# Patient Record
Sex: Female | Born: 2000 | Race: Black or African American | Hispanic: No | Marital: Single | Smoking: Never smoker
Health system: Southern US, Community
[De-identification: ages and names within clinical notes are randomized; demographics above are authoritative.]

## PROBLEM LIST (undated history)

## (undated) ENCOUNTER — Inpatient Hospital Stay: Payer: Self-pay

## (undated) ENCOUNTER — Inpatient Hospital Stay (HOSPITAL_COMMUNITY): Payer: Self-pay

## (undated) DIAGNOSIS — D649 Anemia, unspecified: Secondary | ICD-10-CM

## (undated) DIAGNOSIS — R070 Pain in throat: Secondary | ICD-10-CM

## (undated) DIAGNOSIS — R55 Syncope and collapse: Secondary | ICD-10-CM

## (undated) DIAGNOSIS — K37 Unspecified appendicitis: Secondary | ICD-10-CM

## (undated) DIAGNOSIS — R011 Cardiac murmur, unspecified: Secondary | ICD-10-CM

## (undated) DIAGNOSIS — F445 Conversion disorder with seizures or convulsions: Secondary | ICD-10-CM

## (undated) HISTORY — PX: ELBOW SURGERY: SHX618

## (undated) HISTORY — DX: Syncope and collapse: R55

## (undated) HISTORY — DX: Conversion disorder with seizures or convulsions: F44.5

## (undated) HISTORY — PX: ELBOW CLOSED REDUCTION W/ PERCUANEOUS PINNING: SHX1492

---
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
## 2022-01-25 ENCOUNTER — Ambulatory Visit (INDEPENDENT_AMBULATORY_CARE_PROVIDER_SITE_OTHER): Payer: Medicaid Other | Admitting: Plastic Surgery

## 2022-01-25 ENCOUNTER — Encounter: Payer: Self-pay | Admitting: Plastic Surgery

## 2022-01-25 VITALS — BP 120/78 | HR 75 | Ht 59.0 in | Wt 168.8 lb

## 2022-01-25 DIAGNOSIS — Z6834 Body mass index (BMI) 34.0-34.9, adult: Secondary | ICD-10-CM

## 2022-01-25 DIAGNOSIS — N644 Mastodynia: Secondary | ICD-10-CM | POA: Diagnosis not present

## 2022-01-25 DIAGNOSIS — M546 Pain in thoracic spine: Secondary | ICD-10-CM

## 2022-01-25 DIAGNOSIS — M542 Cervicalgia: Secondary | ICD-10-CM | POA: Diagnosis not present

## 2022-01-25 DIAGNOSIS — N62 Hypertrophy of breast: Secondary | ICD-10-CM | POA: Diagnosis not present

## 2022-01-25 NOTE — Progress Notes (Signed)
Referring Provider Norm Salt, PA [address] [place],  [place] [address]   CC:  Chief Complaint  Patient presents with   Consult      Vicki Clayton is an 21 y.o. female.  HPI: Vicki Clayton is a 21 year old female who presents today with complaints of large breasts which cause her breast pain and chest pain and upper back and neck pain.  She is interested in a breast reduction.  She denies any personal history of breast disease or breast cancer.  She is not a smoker.  Allergies  Allergen Reactions   Antihistamines, Diphenhydramine-Type    Citrus    Pineapple     "throat feels like it will close"    Outpatient Encounter Medications as of 01/25/2022  Medication Sig   albuterol (VENTOLIN HFA) 108 (90 Base) MCG/ACT inhaler Inhale into the lungs.   cetirizine (ZYRTEC) 10 MG tablet    Fluticasone Propionate, Inhal, 50 MCG/ACT AEPB    Guaifenesin 1200 MG TB12 Take 1 tablet (1,200 mg total) by mouth in the morning and at bedtime.   medroxyPROGESTERone (DEPO-PROVERA) 150 MG/ML injection Inject 150 mg into the muscle every 3 (three) months.   traMADol (ULTRAM) 50 MG tablet Take 1 tablet (50 mg total) by mouth every 6 (six) hours as needed.   [DISCONTINUED] benzonatate (TESSALON) 100 MG capsule Take 1 capsule (100 mg total) by mouth every 8 (eight) hours.   [DISCONTINUED] cyclobenzaprine (FLEXERIL) 10 MG tablet Take 1 tablet (10 mg total) by mouth 3 (three) times daily as needed for muscle spasms.   [DISCONTINUED] predniSONE (DELTASONE) 20 MG tablet Take 2 tablets (40 mg total) by mouth daily.   No facility-administered encounter medications on file as of 01/25/2022.     Past Medical History:  Diagnosis Date   Anemia    Heart murmur of newborn    Pain in throat 09/02/2018   Syncope     Past Surgical History:  Procedure Laterality Date   ELBOW CLOSED REDUCTION W/ PERCUANEOUS PINNING     ELBOW SURGERY     KNEE ARTHROSCOPY WITH ANTERIOR CRUCIATE LIGAMENT (ACL)  REPAIR Left 05/30/2019   Procedure: KNEE ARTHROSCOPY WITH ANTERIOR CRUCIATE LIGAMENT (ACL) REPAIR WITH QUADRICEPS AUTOGRAFT;  Surgeon: Bjorn Pippin, MD;  Location: [facility];  Service: Orthopedics;  Laterality: Left;    Family History  Problem Relation Age of Onset   Diabetes Other    Hypertension Other    Cancer Other    Migraines Neg Hx    Seizures Neg Hx    Depression Neg Hx    Anxiety disorder Neg Hx    Bipolar disorder Neg Hx    Schizophrenia Neg Hx    ADD / ADHD Neg Hx    Autism Neg Hx     Social History   Social History Narrative   Vicki Clayton is in the 12th grade at [employer]; she struggles in school. She lives with parents. She enjoys dancing and step.      Review of Systems General: Denies fevers, chills, weight loss CV: Denies chest pain, shortness of breath, palpitations Breast: Large breasts which cause her upper back and neck pain and she feels are out of proportion to her body size.  Physical Exam    01/25/2022    1:02 PM 11/27/2021    3:11 PM 11/03/2021   10:59 AM  Vitals with BMI  Height 4\' 11"     Weight 168 lbs 13 oz    BMI 34.08  Systolic 120 110 580  Diastolic 78 56 79  Pulse 75 59 85    General:  No acute distress,  Alert and oriented, Non-Toxic, Normal speech and affect Breast: Patient has very large breasts which I agree do not fit her body proportions.  Her breasts are pendulous with grade 3 ptosis.  Her sternal notch to nipple distance on the right is 28 cm and 29 cm on the left her nipple to fold distance is 18 cm on the right and 18 cm on the left.  She does not have any appreciable masses no nipple discharge and no nipple abnormalities. Mammogram: She has not begun mammographic screening Assessment/Plan Macromastia: Patient has large breasts and would likely benefit from a breast reduction.  I believe I can remove 400 to 500 g per breast.  We discussed the procedure at length including the location of the incisions and the  unpredictable nature of scarring.  We discussed the surgical risks of bleeding, infection, seroma formation.  We discussed the specific risks associated with a breast reduction including loss of the nipple due to ischemia, loss of feeling in the nipples.  He understands that she will have patches of numbness on her breasts.  We also discussed the fact that she may not be able to breast-feed successfully after breast reduction.  She understands that she will have drains which will need to be removed the day after surgery.  We discussed the specific activity restrictions including no heavy lifting greater than 20 pounds, no vigorous activity, and no submerging the incisions in water for 6 weeks.  She will be able to return to light activity as soon as she is comfortable.  She will be able to resume driving as soon as she has been off narcotic pain medicine for 24 hours.  All questions were answered to her satisfaction.  Will proceed at her request.  Vicki Clayton 01/25/2022, 1:28 PM

## 2022-02-05 ENCOUNTER — Encounter (HOSPITAL_COMMUNITY): Payer: Self-pay

## 2022-02-05 ENCOUNTER — Ambulatory Visit (HOSPITAL_COMMUNITY)
Admission: EM | Admit: 2022-02-05 | Discharge: 2022-02-05 | Disposition: A | Discharge status: Home / Self Care | Payer: Medicaid Other | Attending: Internal Medicine | Admitting: Internal Medicine

## 2022-02-05 DIAGNOSIS — Z3202 Encounter for pregnancy test, result negative: Secondary | ICD-10-CM | POA: Diagnosis present

## 2022-02-05 DIAGNOSIS — Z202 Contact with and (suspected) exposure to infections with a predominantly sexual mode of transmission: Secondary | ICD-10-CM | POA: Insufficient documentation

## 2022-02-05 DIAGNOSIS — N3001 Acute cystitis with hematuria: Secondary | ICD-10-CM | POA: Diagnosis not present

## 2022-02-05 LAB — POCT URINALYSIS DIPSTICK, ED / UC
Bilirubin Urine: NEGATIVE
Glucose, UA: NEGATIVE mg/dL
Ketones, ur: NEGATIVE mg/dL
Nitrite: POSITIVE — AB
Protein, ur: 100 mg/dL — AB
Specific Gravity, Urine: 1.025 (ref 1.005–1.030)
Urobilinogen, UA: 2 mg/dL — ABNORMAL HIGH (ref 0.0–1.0)
pH: 7 (ref 5.0–8.0)

## 2022-02-05 LAB — POC URINE PREG, ED: Preg Test, Ur: NEGATIVE

## 2022-02-05 MED ORDER — CEPHALEXIN 500 MG PO CAPS: 500.0000 mg | ORAL_CAPSULE | Freq: Two times a day (BID) | ORAL | 0 refills | Status: AC

## 2022-02-05 NOTE — ED Triage Notes (Signed)
Pt is here for dysuria x 1 day. Denies taking any medication at this time.

## 2022-02-05 NOTE — Discharge Instructions (Addendum)
You have been diagnosed with urinary tract infection today.  I have prescribed Keflex for you to take 500 mg twice daily for the next 7 days.  If you develop diarrhea while taking this medication you may purchase an over-the-counter probiotic or eat yogurt with live active cultures.  To avoid GI upset please take this medication with food. I have sent your urine for culture to see what type of bacteria grows. We will call you if we need to change the treatment plan based on the results of your urine culture.  If you develop any new or worsening symptoms or do not improve in the next 2 to 3 days, please return.  If your symptoms are severe, please go to the emergency room.  Follow-up with your primary care provider for further evaluation and management of your symptoms as well as ongoing wellness visits.  I hope you feel better! 

## 2022-02-05 NOTE — ED Provider Notes (Signed)
[facility]    CSN: [id number] Arrival date & time: 02/05/22  1640      History   Chief Complaint Chief Complaint  Patient presents with   Urinary Tract Infection   SEXUALLY TRANSMITTED DISEASE    HPI Vicki Clayton is a 22 y.o. female.   Patient presents urgent care for evaluation of slight burning with urination and urinary frequency since yesterday. States she saw some blood to the toilet tissue when wiping after using the restroom to urinate yesterday as well. Reports suprapubic abdominal discomfort, slight nausea without vomiting, and tingling sensation when urinating as well.  Tolerating food and fluids well without emesis, diarrhea, or constipation.  Denies flank pain, fever/chills, dizziness, and recurring/frequent urinary tract infections.  She would also like to be screened for STDs today.  She is not currently having any vaginal symptoms and denies recent new sexual partners/known exposure to STD.  She has not attempted use of any medicines prior to arrival urgent care for symptoms.  She was receiving Depo injection for birth control but has not received this injection in 1 to 2 months.  She is sexually active with monogamous female partner without condom use.  Last menstrual cycle was the beginning of November 2023, however menstrual cycles are irregular. Denies excessive intake of known urinary irritants and history of diabetes mellitus.    Urinary Tract Infection   Past Medical History:  Diagnosis Date   Anemia    Heart murmur of newborn    Pain in throat 09/02/2018   Syncope     Patient Active Problem List   Diagnosis Date Noted   Pain in throat 09/02/2018    Past Surgical History:  Procedure Laterality Date   ELBOW CLOSED REDUCTION W/ PERCUANEOUS PINNING     ELBOW SURGERY     KNEE ARTHROSCOPY WITH ANTERIOR CRUCIATE LIGAMENT (ACL) REPAIR Left 05/30/2019   Procedure: KNEE ARTHROSCOPY WITH ANTERIOR CRUCIATE LIGAMENT (ACL) REPAIR WITH QUADRICEPS  AUTOGRAFT;  Surgeon: Hiram Gash, MD;  Location: [facility];  Service: Orthopedics;  Laterality: Left;    OB History     Gravida  0   Para  0   Term  0   Preterm  0   AB  0   Living  0      SAB  0   IAB  0   Ectopic  0   Multiple  0   Live Births  0            Home Medications    Prior to Admission medications   Medication Sig Start Date End Date Taking? Authorizing Provider  cephALEXin (KEFLEX) 500 MG capsule Take 1 capsule (500 mg total) by mouth 2 (two) times daily for 7 days. 02/05/22 02/12/22 Yes Talbot Grumbling, FNP  albuterol (VENTOLIN HFA) 108 (90 Base) MCG/ACT inhaler Inhale into the lungs. 10/20/17   [provider]  cetirizine (ZYRTEC) 10 MG tablet     [provider]  Fluticasone Propionate, Inhal, 50 MCG/ACT AEPB     [provider]  Guaifenesin 1200 MG TB12 Take 1 tablet (1,200 mg total) by mouth in the morning and at bedtime. 11/03/21   Talbot Grumbling, FNP  medroxyPROGESTERone (DEPO-PROVERA) 150 MG/ML injection Inject 150 mg into the muscle every 3 (three) months.    [provider]  traMADol (ULTRAM) 50 MG tablet Take 1 tablet (50 mg total) by mouth every 6 (six) hours as needed. 07/25/21   White, Leitha Schuller,  NP    Family History Family History  Problem Relation Age of Onset   Diabetes Other    Hypertension Other    Cancer Other    Migraines Neg Hx    Seizures Neg Hx    Depression Neg Hx    Anxiety disorder Neg Hx    Bipolar disorder Neg Hx    Schizophrenia Neg Hx    ADD / ADHD Neg Hx    Autism Neg Hx     Social History Social History   Tobacco Use   Smoking status: Never   Smokeless tobacco: Never  Vaping Use   Vaping Use: Never used  Substance Use Topics   Alcohol use: No   Drug use: No     Allergies   Antihistamines, diphenhydramine-type; Citrus; and Pineapple   Review of Systems Review of Systems Per HPI  Physical Exam Triage Vital Signs ED Triage  Vitals  Enc Vitals Group     BP 02/05/22 1648 126/87     Pulse Rate 02/05/22 1647 78     Resp 02/05/22 1709 18     Temp 02/05/22 1647 98.3 F (36.8 C)     Temp Source 02/05/22 1647 Oral     SpO2 02/05/22 1647 98 %     Weight --      Height --      Head Circumference --      Peak Flow --      Pain Score 02/05/22 1641 9     Pain Loc --      Pain Edu? --      Excl. in GC? --    No data found.  Updated Vital Signs BP 126/87 (BP Location: Left Arm)   Pulse 78   Temp 98.3 F (36.8 C) (Oral)   Resp 18   SpO2 98%   Visual Acuity Right Eye Distance:   Left Eye Distance:   Bilateral Distance:    Right Eye Near:   Left Eye Near:    Bilateral Near:     Physical Exam Vitals and nursing note reviewed.  Constitutional:      Appearance: She is not ill-appearing or toxic-appearing.  HENT:     Head: Normocephalic and atraumatic.     Right Ear: Hearing and external ear normal.     Left Ear: Hearing and external ear normal.     Nose: Nose normal.     Mouth/Throat:     Lips: Pink.     Mouth: Mucous membranes are moist.     Pharynx: No posterior oropharyngeal erythema.  Eyes:     General: Lids are normal. Vision grossly intact. Gaze aligned appropriately.     Extraocular Movements: Extraocular movements intact.     Conjunctiva/sclera: Conjunctivae normal.  Cardiovascular:     Rate and Rhythm: Normal rate and regular rhythm.     Heart sounds: Normal heart sounds, S1 normal and S2 normal.  Pulmonary:     Effort: Pulmonary effort is normal. No respiratory distress.     Breath sounds: Normal breath sounds and air entry.  Abdominal:     General: Bowel sounds are normal.     Palpations: Abdomen is soft.     Tenderness: There is no abdominal tenderness. There is no right CVA tenderness, left CVA tenderness or guarding.  Musculoskeletal:     Cervical back: Neck supple.  Skin:    General: Skin is warm and dry.     Capillary Refill: Capillary refill takes less than 2 seconds.  Findings: No rash.  Neurological:     General: No focal deficit present.     Mental Status: She is alert and oriented to person, place, and time. Mental status is at baseline.     Cranial Nerves: No dysarthria or facial asymmetry.  Psychiatric:        Mood and Affect: Mood normal.        Speech: Speech normal.        Behavior: Behavior normal.        Thought Content: Thought content normal.        Judgment: Judgment normal.      UC Treatments / Results  Labs (all labs ordered are listed, but only abnormal results are displayed) Labs Reviewed  POCT URINALYSIS DIPSTICK, ED / UC - Abnormal; Notable for the following components:      Result Value   Hgb urine dipstick TRACE (*)    Protein, ur 100 (*)    Urobilinogen, UA 2.0 (*)    Nitrite POSITIVE (*)    Leukocytes,Ua MODERATE (*)    All other components within normal limits  URINE CULTURE  POC URINE PREG, ED  CERVICOVAGINAL ANCILLARY ONLY    EKG   Radiology No results found.  Procedures Procedures (including critical care time)  Medications Ordered in UC Medications - No data to display  Initial Impression / Assessment and Plan / UC Course  I have reviewed the triage vital signs and the nursing notes.  Pertinent labs & imaging results that were available during my care of the patient were reviewed by me and considered in my medical decision making (see chart for details).   1. Acute cystitis with hematuria, negative urine pregnancy test Urine pregnancy test is negative. Urinalysis is nitrite positive and shows moderate leukocytes and trace hemoglobin consistent with likely acute uncomplicated cystitis.  We will manage this with Keflex twice daily for the next 7 days.  Urine culture is pending.  Will change therapy based on urine culture results if necessary.  Advised to avoid urinary irritants and increase water intake to at least 64 ounces of water per day to stay well hydrated. May use tylenol as needed for suprapubic  discomfort related to urinary tract infection. Discussed methods of UTI prevention such as voiding after intercourse and avoiding urinary irritants.   2. Possible exposure to STD STI labs pending.  Patient declines HIV and syphilis testing today.  Will notify patient of positive results and treat accordingly when labs come back.  Patient to avoid sexual intercourse until screening testing comes back.  Education provided regarding safe sexual practices and patient encouraged to use protection to prevent spread of STIs.   Discussed physical exam and available lab work findings in clinic with patient.  Counseled patient regarding appropriate use of medications and potential side effects for all medications recommended or prescribed today. Discussed red flag signs and symptoms of worsening condition,when to call the PCP office, return to urgent care, and when to seek higher level of care in the emergency department. Patient verbalizes understanding and agreement with plan. All questions answered. Patient discharged in stable condition.    Final Clinical Impressions(s) / UC Diagnoses   Final diagnoses:  Acute cystitis with hematuria  Urine pregnancy test negative  Possible exposure to STD     Discharge Instructions      You have been diagnosed with urinary tract infection today.  I have prescribed Keflex for you to take 500 mg twice daily for the next 7 days.  If you develop diarrhea while taking this medication you may purchase an over-the-counter probiotic or eat yogurt with live active cultures.  To avoid GI upset please take this medication with food. I have sent your urine for culture to see what type of bacteria grows. We will call you if we need to change the treatment plan based on the results of your urine culture.  If you develop any new or worsening symptoms or do not improve in the next 2 to 3 days, please return.  If your symptoms are severe, please go to the emergency room.  Follow-up  with your primary care provider for further evaluation and management of your symptoms as well as ongoing wellness visits.  I hope you feel better!     ED Prescriptions     Medication Sig Dispense Auth. Provider   cephALEXin (KEFLEX) 500 MG capsule Take 1 capsule (500 mg total) by mouth 2 (two) times daily for 7 days. 14 capsule Carlisle Beers, FNP      PDMP not reviewed this encounter.   Carlisle Beers, [place] 02/05/22 2149

## 2022-02-07 LAB — CERVICOVAGINAL ANCILLARY ONLY
Bacterial Vaginitis (gardnerella): POSITIVE — AB
Candida Glabrata: NEGATIVE
Candida Vaginitis: NEGATIVE
Chlamydia: POSITIVE — AB
Comment: NEGATIVE
Comment: NEGATIVE
Comment: NEGATIVE
Comment: NEGATIVE
Comment: NEGATIVE
Comment: NORMAL
Neisseria Gonorrhea: POSITIVE — AB
Trichomonas: POSITIVE — AB

## 2022-02-07 LAB — URINE CULTURE: Culture: >=100000 — AB

## 2022-02-08 ENCOUNTER — Telehealth (HOSPITAL_COMMUNITY): Payer: Self-pay | Admitting: Emergency Medicine

## 2022-02-08 MED ORDER — METRONIDAZOLE 500 MG PO TABS: 500.0000 mg | ORAL_TABLET | Freq: Two times a day (BID) | ORAL | 0 refills | Status: DC

## 2022-02-08 MED ORDER — DOXYCYCLINE HYCLATE 100 MG PO CAPS: 100.0000 mg | ORAL_CAPSULE | Freq: Two times a day (BID) | ORAL | 0 refills | Status: AC

## 2022-02-08 NOTE — Telephone Encounter (Signed)
Per protocol, patient will need treatment with IM Rocephin 500mg  for positive Gonorrhea. Will also need treatment with Doxycycline, and Metronidazole.   Results seen on mychart Prescription sent to pharmacy on file

## 2022-02-14 ENCOUNTER — Ambulatory Visit (HOSPITAL_COMMUNITY): Payer: Medicaid Other

## 2022-03-08 ENCOUNTER — Telehealth: Payer: Self-pay | Admitting: *Deleted

## 2022-03-08 NOTE — Telephone Encounter (Signed)
Submitted PA request via portal for CPT [id number]  Pending: [id number]

## 2022-03-22 ENCOUNTER — Telehealth: Payer: Self-pay | Admitting: Plastic Surgery

## 2022-03-22 NOTE — Telephone Encounter (Signed)
Vicki Clayton called about a prior auth that was submitted. They need to know has the patient tried things to improve before going straight to a breast reduction sx. For examples physical therapy, chiropractor, they just want to make sure Vicki Clayton has tried other treatment before going to sx. Phone number [phone], fax [phone], [place] [phone]

## 2022-04-04 ENCOUNTER — Telehealth: Payer: Self-pay | Admitting: *Deleted

## 2022-04-04 NOTE — Telephone Encounter (Signed)
Call received from pt to check on auth status. Advised her we had received a call from her insurance asking if she had tried any conservative therapy. she was with her PCP at time of call who states she will place the referral for PT or chiro. Advised pt to contact us when she has set up appointment so we can schedule a f/u when treatment is finished. She verbalized understanding

## 2022-04-07 ENCOUNTER — Emergency Department (HOSPITAL_COMMUNITY)
Admission: EM | Admit: 2022-04-07 | Discharge: 2022-04-07 | Disposition: A | Discharge status: Home / Self Care | Payer: Medicaid Other | Attending: Emergency Medicine | Admitting: Emergency Medicine

## 2022-04-07 ENCOUNTER — Other Ambulatory Visit: Payer: Self-pay

## 2022-04-07 ENCOUNTER — Encounter (HOSPITAL_COMMUNITY): Payer: Self-pay

## 2022-04-07 DIAGNOSIS — T7840XA Allergy, unspecified, initial encounter: Secondary | ICD-10-CM

## 2022-04-07 DIAGNOSIS — L299 Pruritus, unspecified: Secondary | ICD-10-CM | POA: Diagnosis present

## 2022-04-07 MED ORDER — PREDNISONE 20 MG PO TABS: 40.0000 mg | ORAL_TABLET | Freq: Every day | ORAL | 0 refills | Status: AC

## 2022-04-07 MED ORDER — FAMOTIDINE IN NACL 20-0.9 MG/50ML-% IV SOLN
20.0000 mg | Freq: Once | INTRAVENOUS | Status: AC
  Administered 2022-04-07: 20 mg via INTRAVENOUS
  Filled 2022-04-07: qty 50

## 2022-04-07 MED ORDER — FAMOTIDINE 20 MG PO TABS: 20.0000 mg | ORAL_TABLET | Freq: Two times a day (BID) | ORAL | 0 refills | Status: DC

## 2022-04-07 MED ORDER — METHYLPREDNISOLONE SODIUM SUCC 125 MG IJ SOLR
125.0000 mg | Freq: Once | INTRAMUSCULAR | Status: AC
  Administered 2022-04-07: 125 mg via INTRAVENOUS
  Filled 2022-04-07: qty 2

## 2022-04-07 MED ORDER — CETIRIZINE HCL 10 MG PO TABS: ORAL_TABLET | ORAL | 0 refills | Status: DC

## 2022-04-07 MED ORDER — LORATADINE 10 MG PO TABS
10.0000 mg | ORAL_TABLET | Freq: Once | ORAL | Status: AC
  Administered 2022-04-07: 10 mg via ORAL
  Filled 2022-04-07: qty 1

## 2022-04-07 NOTE — ED Provider Notes (Signed)
[facility] Provider Note   CSN: [id number] Arrival date & time: 04/07/22  1136     History  Chief Complaint  Patient presents with   Allergic Reaction    Vicki Clayton is a 22 y.o. female.  Vicki Clayton is a 22 y.o. 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  Patient was given herbal tea at work by a coworker and suddenly felt a tingling and itching feeling in her lips, mouth and throat.  Patient denies any facial swelling, no difficulty breathing.  She reports some generalized itching but no rash noted.  Patient has reported allergy to Benadryl so no meds were given with EMS.  The history is provided by the patient and a significant other.  Allergic Reaction Presenting symptoms: no difficulty swallowing and no rash        Home Medications Prior to Admission medications   Medication Sig Start Date End Date Taking? Authorizing Provider  albuterol (VENTOLIN HFA) 108 (90 Base) MCG/ACT inhaler Inhale into the lungs. 10/20/17   [provider]  cetirizine (ZYRTEC) 10 MG tablet     [provider]  Fluticasone Propionate, Inhal, 50 MCG/ACT AEPB     [provider]  Guaifenesin 1200 MG TB12 Take 1 tablet (1,200 mg total) by mouth in the morning and at bedtime. 11/03/21   Talbot Grumbling, FNP  medroxyPROGESTERone (DEPO-PROVERA) 150 MG/ML injection Inject 150 mg into the muscle every 3 (three) months.    [provider]  metroNIDAZOLE (FLAGYL) 500 MG tablet Take 1 tablet (500 mg total) by mouth 2 (two) times daily. 02/08/22   Lamptey, Myrene Galas, MD  traMADol (ULTRAM) 50 MG tablet Take 1 tablet (50 mg total) by mouth every 6 (six) hours as needed. 07/25/21   Hans Eden, NP      Allergies    Antihistamines, diphenhydramine-type; Citrus; and Pineapple    Review of Systems   Review of Systems   Constitutional:  Negative for chills and fever.  HENT:  Negative for facial swelling and trouble swallowing.   Skin:  Negative for rash.       Pruritus    Physical Exam Updated Vital Signs BP 112/67   Pulse 77   Temp 98.5 F (36.9 C) (Oral)   Resp 18   SpO2 100%  Physical Exam Vitals and nursing note reviewed.  Constitutional:      General: She is not in acute distress.    Appearance: Normal appearance. She is well-developed. She is not ill-appearing or diaphoretic.  HENT:     Head: Normocephalic and atraumatic.     Mouth/Throat:     Comments: No swelling noted of the lips, tongue, posterior oropharynx, no intraoral lesions noted, patient tolerating secretions without difficulty, normal phonation Eyes:     General:        Right eye: No discharge.        Left eye: No discharge.  Cardiovascular:     Rate and Rhythm: Normal rate and regular rhythm.  Pulmonary:     Effort: Pulmonary effort is normal. No respiratory distress.     Breath sounds: Normal breath sounds. No stridor.  Musculoskeletal:     Cervical back: Neck supple.  Skin:    Findings: No rash.     Comments: No hives or other rashes noted.  Neurological:     Mental Status: She is alert and oriented  to person, place, and time.     Coordination: Coordination normal.  Psychiatric:        Mood and Affect: Mood normal.        Behavior: Behavior normal.     ED Results / Procedures / Treatments   Labs (all labs ordered are listed, but only abnormal results are displayed) Labs Reviewed - No data to display  EKG None  Radiology No results found.  Procedures Procedures    Medications Ordered in ED Medications - No data to display  ED Course/ Medical Decision Making/ A&P                             Medical Decision Making Risk OTC drugs. Prescription drug management.   Patient presents via EMS with concerns for an allergic reaction after consuming herbal tea, otherwise no known underlying  allergies to pineapple and citrus.  Upon arrival patient reports some oral and throat itching but no facial swelling, no stridor no difficulty breathing, no contacts.  Exam does not suggest anaphylaxis patient is alert with stable vitals.  Patient has reported Benadryl allergy but takes Zyrtec at home.  Will give Pepcid, loratadine and IV Solu-Medrol and monitor patient.  Patient observed in ED for 2 hours with resolution of symptoms and no worsening.  Will have her continue with Pepcid, cetirizine and prednisone for the next few days.  Discussed return precautions and follow-up.  Discharged home in good condition.        Final Clinical Impression(s) / ED Diagnoses Final diagnoses:  Allergic reaction, initial encounter    Rx / DC Orders ED Discharge Orders          Ordered    cetirizine (ZYRTEC) 10 MG tablet        04/07/22 1340    famotidine (PEPCID) 20 MG tablet  2 times daily        04/07/22 1340    predniSONE (DELTASONE) 20 MG tablet  Daily with breakfast        04/07/22 1340              Vicki Clayton, [place] 04/21/22 1556    Vicki Muskrat, MD 04/21/22 [phone]

## 2022-04-07 NOTE — ED Notes (Signed)
Pt ambulatory to and from restroom with steady gait 

## 2022-04-07 NOTE — Discharge Instructions (Signed)
You had a mild allergic reaction today. Please avoid herbal teas. Take prescribed medications for the next 5 days, return for new or worsening symptoms.

## 2022-04-07 NOTE — ED Triage Notes (Signed)
Pt bib ems for allergic reaction. Pt has multiple food allergies, drank some herbal tea at work and suddenly felt lip tingling and throat burning. No swelling noted, rr e/u, lungs clear

## 2022-04-11 ENCOUNTER — Ambulatory Visit (HOSPITAL_COMMUNITY)
Admission: EM | Admit: 2022-04-11 | Discharge: 2022-04-11 | Disposition: A | Discharge status: Home / Self Care | Payer: Medicaid Other | Attending: Physician Assistant | Admitting: Physician Assistant

## 2022-04-11 ENCOUNTER — Encounter (HOSPITAL_COMMUNITY): Payer: Self-pay | Admitting: Emergency Medicine

## 2022-04-11 DIAGNOSIS — R112 Nausea with vomiting, unspecified: Secondary | ICD-10-CM | POA: Diagnosis not present

## 2022-04-11 DIAGNOSIS — R197 Diarrhea, unspecified: Secondary | ICD-10-CM

## 2022-04-11 DIAGNOSIS — Z1152 Encounter for screening for COVID-19: Secondary | ICD-10-CM | POA: Diagnosis not present

## 2022-04-11 DIAGNOSIS — Z79899 Other long term (current) drug therapy: Secondary | ICD-10-CM | POA: Diagnosis not present

## 2022-04-11 DIAGNOSIS — Z793 Long term (current) use of hormonal contraceptives: Secondary | ICD-10-CM | POA: Diagnosis not present

## 2022-04-11 LAB — POC URINE PREG, ED: Preg Test, Ur: NEGATIVE

## 2022-04-11 LAB — POCT URINALYSIS DIPSTICK, ED / UC
Bilirubin Urine: NEGATIVE
Glucose, UA: NEGATIVE mg/dL
Hgb urine dipstick: NEGATIVE
Ketones, ur: NEGATIVE mg/dL
Leukocytes,Ua: NEGATIVE
Nitrite: NEGATIVE
Protein, ur: 30 mg/dL — AB
Specific Gravity, Urine: >=1.03 (ref 1.005–1.030)
Urobilinogen, UA: 1 mg/dL (ref 0.0–1.0)
pH: 6 (ref 5.0–8.0)

## 2022-04-11 MED ORDER — ONDANSETRON HCL 4 MG PO TABS: 4.0000 mg | ORAL_TABLET | Freq: Three times a day (TID) | ORAL | 0 refills | Status: DC | PRN

## 2022-04-11 MED ORDER — ONDANSETRON 4 MG PO TBDP
4.0000 mg | ORAL_TABLET | Freq: Once | ORAL | Status: AC
  Administered 2022-04-11: 4 mg via ORAL

## 2022-04-11 MED ORDER — ONDANSETRON 4 MG PO TBDP
ORAL_TABLET | ORAL | Status: AC
  Filled 2022-04-11: qty 1

## 2022-04-11 NOTE — ED Triage Notes (Signed)
Pt reports for past couple days since left hospital having sore throat, n/v, dizziness and chills. Reports was given medications that aren't helping.

## 2022-04-11 NOTE — ED Provider Notes (Signed)
[place]    CSN: [id number] Arrival date & time: 04/11/22  [place]      History   Chief Complaint Chief Complaint  Patient presents with   Emesis   Dizziness    HPI Vicki Clayton is a 22 y.o. female.   Patient complains of of sore throat, nausea, vomiting, chills that started a few days ago.  She was seen in the ED 4 days ago for an allergic reaction.  She has been taking Pepcid and Zyrtec.  She reports 1 episode of vomiting today.  Denies congestion, cough.  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    Past Medical History:  Diagnosis Date   Anemia    Heart murmur of newborn    Pain in throat 09/02/2018   Syncope     Patient Active Problem List   Diagnosis Date Noted   Pain in throat 09/02/2018    Past Surgical History:  Procedure Laterality Date   ELBOW CLOSED REDUCTION W/ PERCUANEOUS PINNING     ELBOW SURGERY     KNEE ARTHROSCOPY WITH ANTERIOR CRUCIATE LIGAMENT (ACL) REPAIR Left 05/30/2019   Procedure: KNEE ARTHROSCOPY WITH ANTERIOR CRUCIATE LIGAMENT (ACL) REPAIR WITH QUADRICEPS AUTOGRAFT;  Surgeon: Hiram Gash, MD;  Location: [facility];  Service: Orthopedics;  Laterality: Left;    OB History     Gravida  0   Para  0   Term  0   Preterm  0   AB  0   Living  0      SAB  0   IAB  0   Ectopic  0   Multiple  0   Live Births  0            Home Medications    Prior to Admission medications   Medication Sig Start Date End Date Taking? Authorizing Provider  ondansetron (ZOFRAN) 4 MG tablet Take 1 tablet (4 mg total) by mouth every 8 (eight) hours as needed for nausea or vomiting. 04/11/22  Yes Ward, Lenise Arena, PA-C  albuterol (VENTOLIN HFA) 108 (90 Base) MCG/ACT inhaler Inhale into the lungs. 10/20/17   [provider]  cetirizine (ZYRTEC) 10 MG tablet Take 1 tablet twice daily for the next 5 days, then resume taking 1 tablet daily 04/07/22   Jacqlyn Larsen, PA-C   famotidine (PEPCID) 20 MG tablet Take 1 tablet (20 mg total) by mouth 2 (two) times daily for 5 days. 04/07/22 04/12/22  Jacqlyn Larsen, PA-C  Fluticasone Propionate, Inhal, 50 MCG/ACT AEPB     [provider]  Guaifenesin 1200 MG TB12 Take 1 tablet (1,200 mg total) by mouth in the morning and at bedtime. 11/03/21   Talbot Grumbling, FNP  medroxyPROGESTERone (DEPO-PROVERA) 150 MG/ML injection Inject 150 mg into the muscle every 3 (three) months.    [provider]  metroNIDAZOLE (FLAGYL) 500 MG tablet Take 1 tablet (500 mg total) by mouth 2 (two) times daily. 02/08/22   Lamptey, Myrene Galas, MD  predniSONE (DELTASONE) 20 MG tablet Take 2 tablets (40 mg total) by mouth daily with breakfast for 5 days. Starting tomorrow morning 04/07/22 04/12/22  Jacqlyn Larsen, PA-C  traMADol (ULTRAM) 50 MG tablet Take 1 tablet (50 mg total) by mouth every 6 (six) hours as needed. 07/25/21   Hans Eden, NP    Family History Family History  Problem Relation Age of Onset   Diabetes Other  Hypertension Other    Cancer Other    Migraines Neg Hx    Seizures Neg Hx    Depression Neg Hx    Anxiety disorder Neg Hx    Bipolar disorder Neg Hx    Schizophrenia Neg Hx    ADD / ADHD Neg Hx    Autism Neg Hx     Social History Social History   Tobacco Use   Smoking status: Never   Smokeless tobacco: Never  Vaping Use   Vaping Use: Never used  Substance Use Topics   Alcohol use: No   Drug use: No     Allergies   Antihistamines, diphenhydramine-type; Citrus; and Pineapple   Review of Systems Review of Systems  Constitutional:  Positive for chills. Negative for fever.  HENT:  Positive for sore throat. Negative for ear pain.   Eyes:  Negative for pain and visual disturbance.  Respiratory:  Negative for cough and shortness of breath.   Cardiovascular:  Negative for chest pain and palpitations.  Gastrointestinal:  Positive for nausea and vomiting. Negative for abdominal pain.   Genitourinary:  Negative for dysuria and hematuria.  Musculoskeletal:  Negative for arthralgias and back pain.  Skin:  Negative for color change and rash.  Neurological:  Negative for seizures and syncope.  All other systems reviewed and are negative.    Physical Exam Triage Vital Signs ED Triage Vitals  Enc Vitals Group     BP 04/11/22 1853 98/69     Pulse Rate 04/11/22 1853 76     Resp 04/11/22 1853 16     Temp 04/11/22 1853 98.2 F (36.8 C)     Temp Source 04/11/22 1853 Oral     SpO2 04/11/22 1853 98 %     Weight --      Height --      Head Circumference --      Peak Flow --      Pain Score 04/11/22 1852 0     Pain Loc --      Pain Edu? --      Excl. in [place]? --    No data found.  Updated Vital Signs BP 98/69 (BP Location: Right Arm)   Pulse 76   Temp 98.2 F (36.8 C) (Oral)   Resp 16   SpO2 98%   Visual Acuity Right Eye Distance:   Left Eye Distance:   Bilateral Distance:    Right Eye Near:   Left Eye Near:    Bilateral Near:     Physical Exam Vitals and nursing note reviewed.  Constitutional:      General: She is not in acute distress.    Appearance: She is well-developed.  HENT:     Head: Normocephalic and atraumatic.  Eyes:     Conjunctiva/sclera: Conjunctivae normal.  Cardiovascular:     Rate and Rhythm: Normal rate and regular rhythm.     Heart sounds: No murmur heard. Pulmonary:     Effort: Pulmonary effort is normal. No respiratory distress.     Breath sounds: Normal breath sounds.  Abdominal:     Palpations: Abdomen is soft.     Tenderness: There is no abdominal tenderness.  Musculoskeletal:        General: No swelling.     Cervical back: Neck supple.  Skin:    General: Skin is warm and dry.     Capillary Refill: Capillary refill takes less than 2 seconds.  Neurological:     Mental Status: She is alert.  Psychiatric:        Mood and Affect: Mood normal.      UC Treatments / Results  Labs (all labs ordered are listed, but  only abnormal results are displayed) Labs Reviewed  POCT URINALYSIS DIPSTICK, ED / UC - Abnormal; Notable for the following components:      Result Value   Protein, ur 30 (*)    All other components within normal limits  SARS CORONAVIRUS 2 (TAT 6-24 HRS)  POC URINE PREG, ED    EKG   Radiology No results found.  Procedures Procedures (including critical care time)  Medications Ordered in UC Medications  ondansetron (ZOFRAN-ODT) disintegrating tablet 4 mg (4 mg Oral Given 04/11/22 1927)    Initial Impression / Assessment and Plan / UC Course  I have reviewed the triage vital signs and the nursing notes.  Pertinent labs & imaging results that were available during my care of the patient were reviewed by me and considered in my medical decision making (see chart for details).     Nausea/vomiting.  Pt overall well appearing, advised to push fluids.  Zofran prescribed to take as needed.   COVID test pending.   Final Clinical Impressions(s) / UC Diagnoses   Final diagnoses:  Nausea vomiting and diarrhea     Discharge Instructions      Your urinalysis is normal.  Can take zofran as needed for nausea and vomiting.  Make sure you are drinking plenty of fluids COVID test is pending, will call with results Recommend tylenol or ibuprofen as needed for pain or fever.       ED Prescriptions     Medication Sig Dispense Auth. Provider   ondansetron (ZOFRAN) 4 MG tablet Take 1 tablet (4 mg total) by mouth every 8 (eight) hours as needed for nausea or vomiting. 20 tablet Ward, Lenise Arena, PA-C      PDMP not reviewed this encounter.   Ward, Lenise Arena, PA-C 04/11/22 1939

## 2022-04-11 NOTE — Discharge Instructions (Addendum)
Your urinalysis is normal.  Can take zofran as needed for nausea and vomiting, prescription sent to pharmacy. Make sure you are drinking plenty of fluids COVID test is pending, will call with results Recommend tylenol or ibuprofen as needed for pain or fever.

## 2022-04-12 LAB — SARS CORONAVIRUS 2 (TAT 6-24 HRS): SARS Coronavirus 2: NEGATIVE

## 2022-04-20 NOTE — Therapy (Signed)
OUTPATIENT PHYSICAL THERAPY THORACOLUMBAR EVALUATION   Patient Name: Vicki Clayton MRN: [id number] DOB:2000/06/02, 22 y.o., female Today's Date: 04/22/2022  END OF SESSION:  PT End of Session - 04/22/22 0952     Visit Number 1    Number of Visits 6    Date for PT Re-Evaluation 06/17/22    Authorization Type [place] [facility]CD    PT Start Time 0915    PT Stop Time 1000    PT Time Calculation (min) 45 min    Activity Tolerance Patient tolerated treatment well    Behavior During Therapy [facility] for tasks assessed/performed             Past Medical History:  Diagnosis Date   Anemia    Heart murmur of newborn    Pain in throat 09/02/2018   Syncope    Past Surgical History:  Procedure Laterality Date   ELBOW CLOSED REDUCTION W/ PERCUANEOUS PINNING     ELBOW SURGERY     KNEE ARTHROSCOPY WITH ANTERIOR CRUCIATE LIGAMENT (ACL) REPAIR Left 05/30/2019   Procedure: KNEE ARTHROSCOPY WITH ANTERIOR CRUCIATE LIGAMENT (ACL) REPAIR WITH QUADRICEPS AUTOGRAFT;  Surgeon: Hiram Gash, MD;  Location: [facility];  Service: Orthopedics;  Laterality: Left;   Patient Active Problem List   Diagnosis Date Noted   Pain in throat 09/02/2018    PCP: Trey Sailors, PA  REFERRING PROVIDER: Trey Sailors, PA  REFERRING DIAG: upper back pain  Rationale for Evaluation and Treatment: Rehabilitation  THERAPY DIAG: upper back pain Postural dysfunction  ONSET DATE: chronic  SUBJECTIVE:                                                                                                                                                                                           SUBJECTIVE STATEMENT: Problems sleeping and lying down due to symptoms and sensation of pressure, notes occasional chest wall pain and SOB requiring someone to "squeeze" her to relieve symptoms.  Does note some infrequent N&T into BUEs with lift tasks and job requirements   PERTINENT HISTORY:  none  PAIN:  Are you  having pain? Yes: NPRS scale: 9/10 Pain location: upper back Pain description: ache Aggravating factors: positioning and lifting tasks Relieving factors: position changes  PRECAUTIONS: None  WEIGHT BEARING RESTRICTIONS: No  FALLS:  Has patient fallen in last 6 months? No  OCCUPATION: Hotel manager  PLOF: Independent  PATIENT GOALS: To decrease my symptoms  NEXT MD VISIT: as needed  OBJECTIVE:   DIAGNOSTIC FINDINGS:  none  PATIENT SURVEYS:  NDI 19/50(38% disability)  SCREENING FOR RED FLAGS: negative  POSTURE: rounded shoulders, forward head,  and increased thoracic kyphosis  PALPATION: TTP B upper traps/levators  LUMBAR ROM:   AROM eval  Flexion 100%  Extension 100%  Right lateral flexion 100%  Left lateral flexion 100%  Right rotation 100%  Left rotation 100%   (Blank rows = not tested)  CERVICAL ROM:    AROM A/PROM (deg) 04/22/2022  Flexion 75%  Extension 75%  Right lateral flexion 75%  Left lateral flexion 75%  Right rotation 90%  Left rotation 75%   (Blank rows = not tested)  UE ROM: WNL throughout  AROM Right 04/22/2022 Left 04/22/2022  Shoulder flexion    Shoulder extension    Shoulder abduction    Shoulder adduction    Shoulder extension    Shoulder internal rotation    Shoulder external rotation    Elbow flexion    Elbow extension    Wrist flexion    Wrist extension    Wrist ulnar deviation    Wrist radial deviation    Wrist pronation    Wrist supination     (Blank rows = not tested)  UE MMT: WNL throughout  MMT Right 04/22/2022 Left 04/22/2022  Shoulder flexion    Shoulder extension    Shoulder abduction    Shoulder adduction    Shoulder extension    Shoulder internal rotation    Shoulder external rotation    Middle trapezius    Lower trapezius    Elbow flexion    Elbow extension    Wrist flexion    Wrist extension    Wrist ulnar deviation    Wrist radial deviation    Wrist pronation    Wrist supination     Grip strength     (Blank rows = not tested)  LUMBAR SPECIAL TESTS:  deferred  FUNCTIONAL TESTS:  5 times sit to stand: 7s arms crossed  GAIT: Distance walked: 76ft x2 Assistive device utilized: None Level of assistance: Complete Independence Comments: unremarkable  TODAY'S TREATMENT:                                                                                                                              DATE: Eval    PATIENT EDUCATION:  Education details: Discussed eval findings, rehab rationale and POC and patient is in agreement  Person educated: Patient Education method: Explanation Education comprehension: verbalized understanding and needs further education  HOME EXERCISE PROGRAM: Access Code: [email or web address] URL: [email or web address] Date: 04/22/2022 Prepared by: Sharlynn Oliphant  Exercises - Seated Levator Scapulae Stretch on Wall  - 2 x daily - 5 x weekly - 1 sets - 2 reps - 30s hold - Seated Upper Trap Stretch  - 2 x daily - 5 x weekly - 1 sets - 2 reps - 30s hold  ASSESSMENT:  CLINICAL IMPRESSION: Patient is a 22 y.o. female who was seen today for physical therapy evaluation and treatment for chronic upper back pain. Patient presents with mild restrictions in cervical mobility but full  UE AROM and strength.  Palpation finds tenderness and trigger points in B upper traps and levator regions.  UE paresthesias reproduced with upper trap and levator stretches.  Postural deficits of forward and rounded shoulders observed.  OBJECTIVE IMPAIRMENTS: decreased activity tolerance, decreased knowledge of condition, decreased mobility, decreased ROM, increased fascial restrictions, and pain.   ACTIVITY LIMITATIONS: carrying, lifting, sitting, and standing  PERSONAL FACTORS: Age and Time since onset of injury/illness/exacerbation are also affecting patient's functional outcome.   REHAB POTENTIAL: Good  CLINICAL DECISION MAKING: Stable/uncomplicated  EVALUATION  COMPLEXITY: Low   GOALS: Goals reviewed with patient? Yes  SHORT TERM GOALS+LONG TERM GOALS: Target date: 06/03/2022  Patient to demonstrate independence in HEP  Baseline: [id number] Goal status: INITIAL  2.  Decrease pain to 6/10 Baseline:  Goal status: INITIAL  3.  Increase AROM C-spine to 90% Baseline:   AROM A/PROM (deg) 04/22/2022  Flexion 75%  Extension 75%  Right lateral flexion 75%  Left lateral flexion 75%  Right rotation 90%  Left rotation 75%   Goal status: INITIAL  4.  Decrease worst pain to 6/10 Baseline: 9/10 Goal status: INITIAL  5.  Decrease NDI score to 10/50 Baseline: 19/50 Goal status: INITIAL   PLAN:  PT FREQUENCY: 1x/week  PT DURATION: 6 weeks  PLANNED INTERVENTIONS: Therapeutic exercises, Therapeutic activity, Neuromuscular re-education, Balance training, Gait training, Patient/Family education, Self Care, Joint mobilization, Dry Needling, Spinal mobilization, Manual therapy, and Re-evaluation.  PLAN FOR NEXT SESSION: HEP review and update, postural training, strengthening and flexibility tasks, aerobic work, manual as needed including TPDN   Lanice Shirts, PT 04/22/2022, 9:55 AM   Check all possible CPT codes: [phone] - PT Re-evaluation, 97110- Therapeutic Exercise, [phone]- Neuro Re-education, [phone] - Gait Training, [phone] - Manual Therapy, and 97530 - Therapeutic Activities    Check all conditions that are expected to impact treatment: None of these apply   If treatment provided at initial evaluation, no treatment charged due to lack of authorization.

## 2022-04-22 ENCOUNTER — Ambulatory Visit: Payer: Medicaid Other | Attending: Physician Assistant

## 2022-04-22 ENCOUNTER — Other Ambulatory Visit: Payer: Self-pay

## 2022-04-22 DIAGNOSIS — R293 Abnormal posture: Secondary | ICD-10-CM | POA: Insufficient documentation

## 2022-04-22 DIAGNOSIS — M546 Pain in thoracic spine: Secondary | ICD-10-CM | POA: Diagnosis present

## 2022-04-22 DIAGNOSIS — M7918 Myalgia, other site: Secondary | ICD-10-CM | POA: Insufficient documentation

## 2022-05-06 NOTE — Therapy (Deleted)
OUTPATIENT PHYSICAL THERAPY TREATMENT NOTE   Patient Name: Vicki Clayton MRN: [id number] DOB:Mar 14, 2000, 22 y.o., female Today's Date: 05/06/2022  PCP: Norm Salt, PA  REFERRING PROVIDER: Norm Salt, PA   END OF SESSION:    Past Medical History:  Diagnosis Date   Anemia    Heart murmur of newborn    Pain in throat 09/02/2018   Syncope    Past Surgical History:  Procedure Laterality Date   ELBOW CLOSED REDUCTION W/ PERCUANEOUS PINNING     ELBOW SURGERY     KNEE ARTHROSCOPY WITH ANTERIOR CRUCIATE LIGAMENT (ACL) REPAIR Left 05/30/2019   Procedure: KNEE ARTHROSCOPY WITH ANTERIOR CRUCIATE LIGAMENT (ACL) REPAIR WITH QUADRICEPS AUTOGRAFT;  Surgeon: Bjorn Pippin, MD;  Location: [facility];  Service: Orthopedics;  Laterality: Left;   Patient Active Problem List   Diagnosis Date Noted   Pain in throat 09/02/2018    REFERRING DIAG: upper back pain   THERAPY DIAG: upper back pain Postural dysfunction   Rationale for Evaluation and Treatment Rehabilitation  PERTINENT HISTORY: none   PRECAUTIONS: none   SUBJECTIVE:                                                                                                                                                                                      SUBJECTIVE STATEMENT:  ***   PAIN:  Are you having pain? {[id number]}   OBJECTIVE: (objective measures completed at initial evaluation unless otherwise dated)   DIAGNOSTIC FINDINGS:  none   PATIENT SURVEYS:  NDI 19/50(38% disability)   SCREENING FOR RED FLAGS: negative   POSTURE: rounded shoulders, forward head, and increased thoracic kyphosis   PALPATION: TTP B upper traps/levators   LUMBAR ROM:    AROM eval  Flexion 100%  Extension 100%  Right lateral flexion 100%  Left lateral flexion 100%  Right rotation 100%  Left rotation 100%   (Blank rows = not tested)   CERVICAL ROM:     AROM A/PROM (deg) 04/22/2022  Flexion 75%   Extension 75%  Right lateral flexion 75%  Left lateral flexion 75%  Right rotation 90%  Left rotation 75%   (Blank rows = not tested)   UE ROM: WNL throughout   AROM Right 04/22/2022 Left 04/22/2022  Shoulder flexion      Shoulder extension      Shoulder abduction      Shoulder adduction      Shoulder extension      Shoulder internal rotation      Shoulder external rotation      Elbow flexion      Elbow extension      Wrist flexion  Wrist extension      Wrist ulnar deviation      Wrist radial deviation      Wrist pronation      Wrist supination       (Blank rows = not tested)   UE MMT: WNL throughout   MMT Right 04/22/2022 Left 04/22/2022  Shoulder flexion      Shoulder extension      Shoulder abduction      Shoulder adduction      Shoulder extension      Shoulder internal rotation      Shoulder external rotation      Middle trapezius      Lower trapezius      Elbow flexion      Elbow extension      Wrist flexion      Wrist extension      Wrist ulnar deviation      Wrist radial deviation      Wrist pronation      Wrist supination      Grip strength       (Blank rows = not tested)   LUMBAR SPECIAL TESTS:  deferred   FUNCTIONAL TESTS:  5 times sit to stand: 7s arms crossed   GAIT: Distance walked: 5775ft x2 Assistive device utilized: None Level of assistance: Complete Independence Comments: unremarkable   TODAY'S TREATMENT:                                                                                                                              DATE: Eval      PATIENT EDUCATION:  Education details: Discussed eval findings, rehab rationale and POC and patient is in agreement  Person educated: Patient Education method: Explanation Education comprehension: verbalized understanding and needs further education   HOME EXERCISE PROGRAM: Access Code: [email or web address] URL: [email or web address] Date: 04/22/2022 Prepared by: Gustavus BryantJeffrey Bryant Saye    Exercises - Seated Levator Scapulae Stretch on Wall  - 2 x daily - 5 x weekly - 1 sets - 2 reps - 30s hold - Seated Upper Trap Stretch  - 2 x daily - 5 x weekly - 1 sets - 2 reps - 30s hold   ASSESSMENT:   CLINICAL IMPRESSION: Patient is a 22 y.o. female who was seen today for physical therapy evaluation and treatment for chronic upper back pain. Patient presents with mild restrictions in cervical mobility but full UE AROM and strength.  Palpation finds tenderness and trigger points in B upper traps and levator regions.  UE paresthesias reproduced with upper trap and levator stretches.  Postural deficits of forward and rounded shoulders observed.   OBJECTIVE IMPAIRMENTS: decreased activity tolerance, decreased knowledge of condition, decreased mobility, decreased ROM, increased fascial restrictions, and pain.    ACTIVITY LIMITATIONS: carrying, lifting, sitting, and standing   PERSONAL FACTORS: Age and Time since onset of injury/illness/exacerbation are also affecting patient's functional outcome.    REHAB POTENTIAL: Good   CLINICAL DECISION MAKING:  Stable/uncomplicated   EVALUATION COMPLEXITY: Low     GOALS: Goals reviewed with patient? Yes   SHORT TERM GOALS+LONG TERM GOALS: Target date: 06/03/2022   Patient to demonstrate independence in HEP  Baseline: [id number] Goal status: INITIAL   2.  Decrease pain to 6/10 Baseline:  Goal status: INITIAL   3.  Increase AROM C-spine to 90% Baseline:   AROM A/PROM (deg) 04/22/2022  Flexion 75%  Extension 75%  Right lateral flexion 75%  Left lateral flexion 75%  Right rotation 90%  Left rotation 75%    Goal status: INITIAL   4.  Decrease worst pain to 6/10 Baseline: 9/10 Goal status: INITIAL   5.  Decrease NDI score to 10/50 Baseline: 19/50 Goal status: INITIAL     PLAN:   PT FREQUENCY: 1x/week   PT DURATION: 6 weeks   PLANNED INTERVENTIONS: Therapeutic exercises, Therapeutic activity, Neuromuscular re-education,  Balance training, Gait training, Patient/Family education, Self Care, Joint mobilization, Dry Needling, Spinal mobilization, Manual therapy, and Re-evaluation.   PLAN FOR NEXT SESSION: HEP review and update, postural training, strengthening and flexibility tasks, aerobic work, manual as needed including TPDN   Hildred LaserJeffrey M Letita Prentiss, PT 05/06/2022, 8:03 AM

## 2022-05-09 ENCOUNTER — Ambulatory Visit: Payer: Medicaid Other

## 2022-05-12 ENCOUNTER — Emergency Department (HOSPITAL_COMMUNITY): Payer: Medicaid Other

## 2022-05-12 ENCOUNTER — Ambulatory Visit (HOSPITAL_COMMUNITY)
Admission: EM | Admit: 2022-05-12 | Discharge: 2022-05-12 | Disposition: A | Discharge status: Home / Self Care | Payer: Medicaid Other | Attending: Emergency Medicine | Admitting: Emergency Medicine

## 2022-05-12 ENCOUNTER — Encounter (HOSPITAL_COMMUNITY): Payer: Self-pay | Admitting: *Deleted

## 2022-05-12 ENCOUNTER — Encounter (HOSPITAL_COMMUNITY): Payer: Self-pay

## 2022-05-12 ENCOUNTER — Emergency Department (HOSPITAL_COMMUNITY)
Admission: EM | Admit: 2022-05-12 | Discharge: 2022-05-13 | Disposition: A | Discharge status: Home / Self Care | Payer: Medicaid Other | Attending: Emergency Medicine | Admitting: Emergency Medicine

## 2022-05-12 DIAGNOSIS — R1084 Generalized abdominal pain: Secondary | ICD-10-CM | POA: Diagnosis present

## 2022-05-12 DIAGNOSIS — K5900 Constipation, unspecified: Secondary | ICD-10-CM | POA: Diagnosis not present

## 2022-05-12 DIAGNOSIS — R1031 Right lower quadrant pain: Secondary | ICD-10-CM | POA: Insufficient documentation

## 2022-05-12 DIAGNOSIS — N939 Abnormal uterine and vaginal bleeding, unspecified: Secondary | ICD-10-CM | POA: Diagnosis not present

## 2022-05-12 DIAGNOSIS — R1030 Lower abdominal pain, unspecified: Secondary | ICD-10-CM | POA: Diagnosis present

## 2022-05-12 DIAGNOSIS — R112 Nausea with vomiting, unspecified: Secondary | ICD-10-CM | POA: Diagnosis not present

## 2022-05-12 LAB — COMPREHENSIVE METABOLIC PANEL
ALT: 17 U/L (ref 0–44)
AST: 17 U/L (ref 15–41)
Albumin: 3.9 g/dL (ref 3.5–5.0)
Alkaline Phosphatase: 75 U/L (ref 38–126)
Anion gap: 8 (ref 5–15)
BUN: 12 mg/dL (ref 6–20)
CO2: 25 mmol/L (ref 22–32)
Calcium: 9.1 mg/dL (ref 8.9–10.3)
Chloride: 105 mmol/L (ref 98–111)
Creatinine, Ser: 0.74 mg/dL (ref 0.44–1.00)
GFR, Estimated: >60 mL/min (ref >60)
Glucose, Bld: 93 mg/dL (ref 70–99)
Potassium: 4.1 mmol/L (ref 3.5–5.1)
Sodium: 138 mmol/L (ref 135–145)
Total Bilirubin: 0.6 mg/dL (ref 0.3–1.2)
Total Protein: 7.2 g/dL (ref 6.5–8.1)

## 2022-05-12 LAB — POCT URINALYSIS DIPSTICK, ED / UC
Bilirubin Urine: NEGATIVE
Glucose, UA: NEGATIVE mg/dL
Ketones, ur: NEGATIVE mg/dL
Leukocytes,Ua: NEGATIVE
Nitrite: NEGATIVE
Protein, ur: NEGATIVE mg/dL
Specific Gravity, Urine: >=1.03 (ref 1.005–1.030)
Urobilinogen, UA: 0.2 mg/dL (ref 0.0–1.0)
pH: 6 (ref 5.0–8.0)

## 2022-05-12 LAB — CBC WITH DIFFERENTIAL/PLATELET
Abs Immature Granulocytes: 0.01 10*3/uL (ref 0.00–0.07)
Basophils Absolute: 0 10*3/uL (ref 0.0–0.1)
Basophils Relative: 1 %
Eosinophils Absolute: 0.1 10*3/uL (ref 0.0–0.5)
Eosinophils Relative: 2 %
HCT: 35.3 % — ABNORMAL LOW (ref 36.0–46.0)
Hemoglobin: 11.4 g/dL — ABNORMAL LOW (ref 12.0–15.0)
Immature Granulocytes: 0 %
Lymphocytes Relative: 50 %
Lymphs Abs: 3 10*3/uL (ref 0.7–4.0)
MCH: 30.5 pg (ref 26.0–34.0)
MCHC: 32.3 g/dL (ref 30.0–36.0)
MCV: 94.4 fL (ref 80.0–100.0)
Monocytes Absolute: 0.3 10*3/uL (ref 0.1–1.0)
Monocytes Relative: 6 %
Neutro Abs: 2.5 10*3/uL (ref 1.7–7.7)
Neutrophils Relative %: 41 %
Platelets: 262 10*3/uL (ref 150–400)
RBC: 3.74 MIL/uL — ABNORMAL LOW (ref 3.87–5.11)
RDW: 12.2 % (ref 11.5–15.5)
WBC: 6 10*3/uL (ref 4.0–10.5)
nRBC: 0 % (ref 0.0–0.2)

## 2022-05-12 LAB — URINALYSIS, ROUTINE W REFLEX MICROSCOPIC
Bacteria, UA: NONE SEEN
Bilirubin Urine: NEGATIVE
Glucose, UA: NEGATIVE mg/dL
Ketones, ur: NEGATIVE mg/dL
Leukocytes,Ua: NEGATIVE
Nitrite: NEGATIVE
Protein, ur: 30 mg/dL — AB
RBC / HPF: >50 RBC/hpf (ref 0–5)
Specific Gravity, Urine: 1.024 (ref 1.005–1.030)
pH: 5 (ref 5.0–8.0)

## 2022-05-12 LAB — I-STAT BETA HCG BLOOD, ED (MC, WL, AP ONLY): I-stat hCG, quantitative: <5 m[IU]/mL (ref <5)

## 2022-05-12 LAB — LIPASE, BLOOD: Lipase: 31 U/L (ref 11–51)

## 2022-05-12 LAB — POC URINE PREG, ED: Preg Test, Ur: NEGATIVE

## 2022-05-12 MED ORDER — IOHEXOL 350 MG/ML SOLN
75.0000 mL | Freq: Once | INTRAVENOUS | Status: AC | PRN
  Administered 2022-05-12: 75 mL via INTRAVENOUS

## 2022-05-12 NOTE — ED Triage Notes (Signed)
Pt states yesterday she started having lower abdominal pain, rib pain, back pain, headache, dizziness, and chills. She states she took something yesterday but nothing today.   She states she is on her menstrual cycle starting yesterday but she says she is having "really big blood clots"

## 2022-05-12 NOTE — ED Provider Notes (Signed)
[facility]    CSN: [id number] Arrival date & time: 05/12/22  1835      History   Chief Complaint Chief Complaint  Patient presents with   Abdominal Pain   Flank Pain   Chills   Headache    HPI Vicki Clayton is a 22 y.o. female.  Abdominal pain started yesterday, started increasing in severity today.  Reports 10 out of 10 pain.  Reporting an episode of vomiting earlier this morning.  She was able to tolerate fluids afterwards. On further questioning reports she has not pooped in several months.  We went into detail about this and she heavily denies passing any stool through all of March or April.  No fevers  Started her menstrual cycle yesterday  No prior abd surgeries  Past Medical History:  Diagnosis Date   Anemia    Heart murmur of newborn    Pain in throat 09/02/2018   Syncope     Patient Active Problem List   Diagnosis Date Noted   Pain in throat 09/02/2018    Past Surgical History:  Procedure Laterality Date   ELBOW CLOSED REDUCTION W/ PERCUANEOUS PINNING     ELBOW SURGERY     KNEE ARTHROSCOPY WITH ANTERIOR CRUCIATE LIGAMENT (ACL) REPAIR Left 05/30/2019   Procedure: KNEE ARTHROSCOPY WITH ANTERIOR CRUCIATE LIGAMENT (ACL) REPAIR WITH QUADRICEPS AUTOGRAFT;  Surgeon: Bjorn Pippin, MD;  Location: [facility];  Service: Orthopedics;  Laterality: Left;    OB History     Gravida  0   Para  0   Term  0   Preterm  0   AB  0   Living  0      SAB  0   IAB  0   Ectopic  0   Multiple  0   Live Births  0            Home Medications    Prior to Admission medications   Medication Sig Start Date End Date Taking? Authorizing Provider  cetirizine (ZYRTEC) 10 MG tablet Take 1 tablet twice daily for the next 5 days, then resume taking 1 tablet daily 04/07/22  Yes Dartha Lodge, PA-C  famotidine (PEPCID) 20 MG tablet Take 1 tablet (20 mg total) by mouth 2 (two) times daily for 5 days. 04/07/22 05/12/22 Yes Dartha Lodge,  PA-C  albuterol (VENTOLIN HFA) 108 (90 Base) MCG/ACT inhaler Inhale into the lungs. 10/20/17   [provider]  Fluticasone Propionate, Inhal, 50 MCG/ACT AEPB     [provider]  Guaifenesin 1200 MG TB12 Take 1 tablet (1,200 mg total) by mouth in the morning and at bedtime. 11/03/21   Carlisle Beers, FNP  medroxyPROGESTERone (DEPO-PROVERA) 150 MG/ML injection Inject 150 mg into the muscle every 3 (three) months.    [provider]  metroNIDAZOLE (FLAGYL) 500 MG tablet Take 1 tablet (500 mg total) by mouth 2 (two) times daily. 02/08/22   Lamptey, Britta Mccreedy, MD  ondansetron (ZOFRAN) 4 MG tablet Take 1 tablet (4 mg total) by mouth every 8 (eight) hours as needed for nausea or vomiting. 04/11/22   Ward, Tylene Fantasia, PA-C  traMADol (ULTRAM) 50 MG tablet Take 1 tablet (50 mg total) by mouth every 6 (six) hours as needed. 07/25/21   Valinda Hoar, NP    Family History Family History  Problem Relation Age of Onset   Diabetes Other    Hypertension Other    Cancer Other  Migraines Neg Hx    Seizures Neg Hx    Depression Neg Hx    Anxiety disorder Neg Hx    Bipolar disorder Neg Hx    Schizophrenia Neg Hx    ADD / ADHD Neg Hx    Autism Neg Hx     Social History Social History   Tobacco Use   Smoking status: Never   Smokeless tobacco: Never  Vaping Use   Vaping Use: Never used  Substance Use Topics   Alcohol use: No   Drug use: No     Allergies   Antihistamines, diphenhydramine-type; Citrus; and Pineapple   Review of Systems Review of Systems As per HPI  Physical Exam Triage Vital Signs ED Triage Vitals  Enc Vitals Group     BP 05/12/22 1920 124/82     Pulse Rate 05/12/22 1920 74     Resp 05/12/22 1920 16     Temp 05/12/22 1920 98 F (36.7 C)     Temp Source 05/12/22 1920 Oral     SpO2 05/12/22 1920 98 %     Weight --      Height --      Head Circumference --      Peak Flow --      Pain Score 05/12/22 1917 9     Pain Loc --      Pain  Edu? --      Excl. in GC? --    No data found.  Updated Vital Signs BP 124/82 (BP Location: Right Arm)   Pulse 74   Temp 98 F (36.7 C) (Oral)   Resp 16   LMP 05/11/2022   SpO2 98%   Physical Exam Vitals and nursing note reviewed.  Constitutional:      Appearance: Normal appearance.     Comments: Patient is bent over in fetal position on the table.  HENT:     Mouth/Throat:     Mouth: Mucous membranes are moist.     Pharynx: Oropharynx is clear.  Eyes:     Conjunctiva/sclera: Conjunctivae normal.  Cardiovascular:     Rate and Rhythm: Normal rate and regular rhythm.     Heart sounds: Normal heart sounds.  Pulmonary:     Effort: Pulmonary effort is normal. No respiratory distress.     Breath sounds: Normal breath sounds.  Abdominal:     General: Bowel sounds are normal.     Palpations: Abdomen is soft.     Tenderness: There is no abdominal tenderness. There is no right CVA tenderness, left CVA tenderness, guarding or rebound.     Comments: Belly is nontender to palpation.  No guarding or rebound.  Musculoskeletal:        General: Normal range of motion.  Skin:    General: Skin is warm and dry.  Neurological:     Mental Status: She is alert and oriented to person, place, and time.      UC Treatments / Results  Labs (all labs ordered are listed, but only abnormal results are displayed) Labs Reviewed  POCT URINALYSIS DIPSTICK, ED / UC - Abnormal; Notable for the following components:      Result Value   Hgb urine dipstick MODERATE (*)    All other components within normal limits  URINE CULTURE  POC URINE PREG, ED    EKG   Radiology No results found.  Procedures Procedures (including critical care time)  Medications Ordered in UC Medications - No data to display  Initial Impression /  Assessment and Plan / UC Course  I have reviewed the triage vital signs and the nursing notes.  Pertinent labs & imaging results that were available during my care of the  patient were reviewed by me and considered in my medical decision making (see chart for details).  Afebrile, stable vitals, well-appearing.  Is actually nontender on exam but reporting 10/10 pain and stating "its really bad"  UPT negative UA with moderate hemoglobin, elevated specific gravity.  At first was reporting normal BMs but then stating she has not gone in several months. With her reporting increasing pain and absence of bowel movement I have sent her to the emergency department for abdominal scan.  Final Clinical Impressions(s) / UC Diagnoses   Final diagnoses:  Generalized abdominal pain     Discharge Instructions      Sent to ED     ED Prescriptions   None    PDMP not reviewed this encounter.   Sumaiya Arruda, Ray Church 05/12/22 2021

## 2022-05-12 NOTE — ED Provider Triage Note (Signed)
Emergency Medicine Provider Triage Evaluation Note  Vicki Clayton , a 22 y.o. female  was evaluated in triage.  Pt complains of constipation. Endorse lower abd pain and constipation x 1 month with minimal BM.  Today felt nauseous and vomiting with chills.  No dysuria, no abd surgery  Review of Systems  Positive: As above Negative: As above  Physical Exam  BP 100/67 (BP Location: Right Arm)   Pulse 73   Temp 98.7 F (37.1 C)   Resp 16   LMP 05/11/2022   SpO2 97%  Gen:   Awake, no distress   Resp:  Normal effort  MSK:   Moves extremities without difficulty  Other:    Medical Decision Making  Medically screening exam initiated at 8:47 PM.  Appropriate orders placed.  Zyla Zarza was informed that the remainder of the evaluation will be completed by another provider, this initial triage assessment does not replace that evaluation, and the importance of remaining in the ED until their evaluation is complete.     Fayrene Helper, PA-C 05/12/22 2048

## 2022-05-12 NOTE — ED Triage Notes (Signed)
Pt states that she has been having abd pain and constipation with nausea. Reports that she has not has a BM since March. Also having headaches.

## 2022-05-12 NOTE — ED Notes (Signed)
Patient is being discharged from the Urgent Care and sent to the Emergency Department via POV . Per Marlow Baars, PA-C, patient is in need of higher level of care due to abdominal pain, higher level of care needed. . Patient is aware and verbalizes understanding of plan of care.  Vitals:   05/12/22 1920  BP: 124/82  Pulse: 74  Resp: 16  Temp: 98 F (36.7 C)  SpO2: 98%

## 2022-05-12 NOTE — Discharge Instructions (Addendum)
Sent to ED

## 2022-05-13 ENCOUNTER — Emergency Department (HOSPITAL_COMMUNITY): Payer: Medicaid Other

## 2022-05-13 MED ORDER — POLYETHYLENE GLYCOL 3350 17 G PO PACK: 17.0000 g | PACK | Freq: Every day | ORAL | 0 refills | Status: DC

## 2022-05-13 MED ORDER — LACTATED RINGERS IV BOLUS
1000.0000 mL | Freq: Once | INTRAVENOUS | Status: AC
  Administered 2022-05-13: 1000 mL via INTRAVENOUS

## 2022-05-13 MED ORDER — ONDANSETRON HCL 4 MG/2ML IJ SOLN
4.0000 mg | Freq: Once | INTRAMUSCULAR | Status: AC
  Administered 2022-05-13: 4 mg via INTRAVENOUS
  Filled 2022-05-13: qty 2

## 2022-05-13 MED ORDER — ONDANSETRON 4 MG PO TBDP: 4.0000 mg | ORAL_TABLET | Freq: Three times a day (TID) | ORAL | 0 refills | Status: DC | PRN

## 2022-05-13 MED ORDER — KETOROLAC TROMETHAMINE 15 MG/ML IJ SOLN
15.0000 mg | Freq: Once | INTRAMUSCULAR | Status: AC
  Administered 2022-05-13: 15 mg via INTRAVENOUS
  Filled 2022-05-13: qty 1

## 2022-05-13 NOTE — Discharge Instructions (Signed)
Your testing is reassuring.  No evidence of appendicitis or ovarian cyst.  Take the MiraLAX and nausea medication as prescribed.  As we discussed, early appendicitis is still a possibility and may not show up on imaging right away.  Return to the ED with worsening pain especially to the right lower abdomen, persistent vomiting, fever, not able to eat or drink or any other concerns.

## 2022-05-13 NOTE — ED Provider Notes (Signed)
[place] [facility] AT [facility] Provider Note   CSN: [id number] Arrival date & time: 05/12/22  2025     History  Chief Complaint  Patient presents with   Abdominal Pain    Vicki Clayton is a 22 y.o. female.  Patient sent from care with lower abdominal pain since yesterday.  She started her menses yesterday but this does not feel like that.  States she has pain across her lower abdomen and has had multiple episodes of vomiting today with anytime she tries to eat or drink something.  Has had sick contacts at home and at work.  No diarrhea.  States she been constipated for the better part of the month and having hard stools that she has to strain on.  Last bowel movement was several days ago.  Contrary to urgent care note which states that she did not have any bowel movements "for any of March or April".  States she has moved her bowels but they have been hard and she has had to strain.  No pain with urination or blood in the urine.  She is having vaginal bleeding and clots consistent with her menses.  No previous abdominal surgeries.  No chest pain or shortness of breath.    The history is provided by the patient.  Abdominal Pain Associated symptoms: constipation, nausea, vaginal bleeding and vomiting   Associated symptoms: no cough, no dysuria, no fever, no hematuria and no shortness of breath        Home Medications Prior to Admission medications   Medication Sig Start Date End Date Taking? Authorizing Provider  albuterol (VENTOLIN HFA) 108 (90 Base) MCG/ACT inhaler Inhale 1 puff into the lungs every 6 (six) hours as needed for shortness of breath. 10/20/17  Yes [provider]  famotidine (PEPCID) 20 MG tablet Take 1 tablet (20 mg total) by mouth 2 (two) times daily for 5 days. 04/07/22 05/12/22 Yes Dartha Lodge, PA-C  medroxyPROGESTERone (DEPO-PROVERA) 150 MG/ML injection Inject 150 mg into the muscle every 3 (three) months.   Yes [provider]  ondansetron (ZOFRAN) 4 MG tablet Take 1 tablet (4 mg total) by mouth every 8 (eight) hours as needed for nausea or vomiting. 04/11/22  Yes Ward, Tylene Fantasia, PA-C  traMADol (ULTRAM) 50 MG tablet Take 1 tablet (50 mg total) by mouth every 6 (six) hours as needed. Patient taking differently: Take 50 mg by mouth every 6 (six) hours as needed for moderate pain. 07/25/21  Yes White, Elita Boone, NP  cetirizine (ZYRTEC) 10 MG tablet Take 1 tablet twice daily for the next 5 days, then resume taking 1 tablet daily Patient not taking: Reported on 05/12/2022 04/07/22   Dartha Lodge, PA-C  Guaifenesin 1200 MG TB12 Take 1 tablet (1,200 mg total) by mouth in the morning and at bedtime. Patient not taking: Reported on 05/12/2022 11/03/21   Carlisle Beers, FNP  metroNIDAZOLE (FLAGYL) 500 MG tablet Take 1 tablet (500 mg total) by mouth 2 (two) times daily. Patient not taking: Reported on 05/12/2022 02/08/22   Merrilee Jansky, MD      Allergies    Antihistamines, diphenhydramine-type; Citrus; and Pineapple    Review of Systems   Review of Systems  Constitutional:  Positive for activity change and appetite change. Negative for fever.  HENT:  Negative for congestion and rhinorrhea.   Respiratory:  Negative for cough, chest tightness and shortness of breath.   Gastrointestinal:  Positive for abdominal pain, constipation,  nausea and vomiting.  Genitourinary:  Positive for vaginal bleeding. Negative for dysuria and hematuria.  Musculoskeletal:  Negative for arthralgias and myalgias.  Skin:  Negative for rash.  Neurological:  Negative for dizziness, weakness and headaches.   all other systems are negative except as noted in the HPI and PMH.    Physical Exam Updated Vital Signs BP 128/72   Pulse 68   Temp 98.4 F (36.9 C) (Oral)   Resp 18   LMP 05/11/2022   SpO2 100%  Physical Exam Vitals and nursing note reviewed.  Constitutional:      General: She is not in acute distress.     Appearance: She is well-developed.  HENT:     Head: Normocephalic and atraumatic.     Mouth/Throat:     Pharynx: No oropharyngeal exudate.  Eyes:     Conjunctiva/sclera: Conjunctivae normal.     Pupils: Pupils are equal, round, and reactive to light.  Neck:     Comments: No meningismus. Cardiovascular:     Rate and Rhythm: Normal rate and regular rhythm.     Heart sounds: Normal heart sounds. No murmur heard. Pulmonary:     Effort: Pulmonary effort is normal. No respiratory distress.     Breath sounds: Normal breath sounds.  Abdominal:     Palpations: Abdomen is soft.     Tenderness: There is abdominal tenderness. There is no guarding or rebound.     Comments: Suprapubic and right lower quadrant tenderness, no guarding or rebound  Genitourinary:    Comments:  Declines rectal exam Musculoskeletal:        General: No tenderness. Normal range of motion.     Cervical back: Normal range of motion and neck supple.     Comments: No CVA tenderness  Skin:    General: Skin is warm.  Neurological:     Mental Status: She is alert and oriented to person, place, and time.     Cranial Nerves: No cranial nerve deficit.     Motor: No abnormal muscle tone.     Coordination: Coordination normal.     Comments:  5/5 strength throughout. CN 2-12 intact.Equal grip strength.   Psychiatric:        Behavior: Behavior normal.     ED Results / Procedures / Treatments   Labs (all labs ordered are listed, but only abnormal results are displayed) Labs Reviewed  CBC WITH DIFFERENTIAL/PLATELET - Abnormal; Notable for the following components:      Result Value   RBC 3.74 (*)    Hemoglobin 11.4 (*)    HCT 35.3 (*)    All other components within normal limits  URINALYSIS, ROUTINE W REFLEX MICROSCOPIC - Abnormal; Notable for the following components:   APPearance HAZY (*)    Hgb urine dipstick LARGE (*)    Protein, ur 30 (*)    All other components within normal limits  COMPREHENSIVE METABOLIC  PANEL  LIPASE, BLOOD  I-STAT BETA HCG BLOOD, ED ([facility], [facility], [facility] ONLY)    EKG None  Radiology US Pelvis Complete  Result Date: 05/13/2022 CLINICAL DATA:  Initial evaluation for right lower quadrant pain. EXAM: TRANSABDOMINAL ULTRASOUND OF PELVIS DOPPLER ULTRASOUND OF OVARIES TECHNIQUE: Transabdominal ultrasound examination of the pelvis was performed including evaluation of the uterus, ovaries, adnexal regions, and pelvic cul-de-sac. Color and duplex Doppler ultrasound was utilized to evaluate blood flow to the ovaries. COMPARISON:  Prior CT from 05/12/2022. FINDINGS: Uterus Measurements: 7.1 x 3.6 x 3.8 cm = volume: 50.4 mL.  Uterus is anteverted. No discrete fibroid or other myometrial abnormality. Endometrium Thickness: 5.9 mm.  No focal abnormality visualized. Right ovary Measurements: 4.2 x 2.2 x 2.5 cm = volume: 12.0 mL. Normal appearance/no adnexal mass. Left ovary Measurements: 2.8 x 2.3 x 3.7 cm = volume: 9.3 mL. Normal appearance/no adnexal mass. Pulsed Doppler evaluation demonstrates normal low-resistance arterial and venous waveforms in both ovaries. Other: No free fluid seen within the pelvis. IMPRESSION: Normal pelvic ultrasound. No evidence for torsion or other acute abnormality. Electronically Signed   By: Rise Mu M.D.   On: 05/13/2022 03:14   [place] Art/Ven Flow Abd Pelv Doppler  Result Date: 05/13/2022 CLINICAL DATA:  Initial evaluation for right lower quadrant pain. EXAM: TRANSABDOMINAL ULTRASOUND OF PELVIS DOPPLER ULTRASOUND OF OVARIES TECHNIQUE: Transabdominal ultrasound examination of the pelvis was performed including evaluation of the uterus, ovaries, adnexal regions, and pelvic cul-de-sac. Color and duplex Doppler ultrasound was utilized to evaluate blood flow to the ovaries. COMPARISON:  Prior CT from 05/12/2022. FINDINGS: Uterus Measurements: 7.1 x 3.6 x 3.8 cm = volume: 50.4 mL. Uterus is anteverted. No discrete fibroid or other myometrial abnormality. Endometrium  Thickness: 5.9 mm.  No focal abnormality visualized. Right ovary Measurements: 4.2 x 2.2 x 2.5 cm = volume: 12.0 mL. Normal appearance/no adnexal mass. Left ovary Measurements: 2.8 x 2.3 x 3.7 cm = volume: 9.3 mL. Normal appearance/no adnexal mass. Pulsed Doppler evaluation demonstrates normal low-resistance arterial and venous waveforms in both ovaries. Other: No free fluid seen within the pelvis. IMPRESSION: Normal pelvic ultrasound. No evidence for torsion or other acute abnormality. Electronically Signed   By: Rise Mu M.D.   On: 05/13/2022 03:14   CT ABDOMEN PELVIS W CONTRAST  Result Date: 05/12/2022 CLINICAL DATA:  Acute abdominal pain EXAM: CT ABDOMEN AND PELVIS WITH CONTRAST TECHNIQUE: Multidetector CT imaging of the abdomen and pelvis was performed using the standard protocol following bolus administration of intravenous contrast. RADIATION DOSE REDUCTION: This exam was performed according to the departmental dose-optimization program which includes automated exposure control, adjustment of the mA and/or kV according to patient size and/or use of iterative reconstruction technique. CONTRAST:  75mL OMNIPAQUE IOHEXOL 350 MG/ML SOLN COMPARISON:  None Available. FINDINGS: Lower chest: No acute abnormality. Hepatobiliary: No focal liver abnormality is seen. No gallstones, gallbladder wall thickening, or biliary dilatation. Pancreas: Unremarkable. No pancreatic ductal dilatation or surrounding inflammatory changes. Spleen: Normal in size without focal abnormality. Adrenals/Urinary Tract: Adrenal glands are unremarkable. Kidneys are normal, without renal calculi, focal lesion, or hydronephrosis. Bladder is unremarkable. Stomach/Bowel: Stomach is within normal limits. Appendix appears normal. No evidence of bowel wall thickening, distention, or inflammatory changes. Vascular/Lymphatic: No significant vascular findings are present. No enlarged abdominal or pelvic lymph nodes. Reproductive: Uterus and  bilateral adnexa are unremarkable. Other: There is a small fat containing umbilical hernia. No free fluid. Musculoskeletal: No acute or significant osseous findings. IMPRESSION: 1. No acute localizing process in the abdomen or pelvis. 2. Small fat containing umbilical hernia. Electronically Signed   By: Darliss Cheney M.D.   On: 05/12/2022 23:00    Procedures Procedures    Medications Ordered in ED Medications  iohexol (OMNIPAQUE) 350 MG/ML injection 75 mL (75 mLs Intravenous Contrast Given 05/12/22 2252)    ED Course/ Medical Decision Making/ A&P                             Medical Decision Making Amount and/or Complexity of Data Reviewed Labs: ordered.  Decision-making details documented in ED Course. Radiology: ordered and independent interpretation performed. Decision-making details documented in ED Course. ECG/medicine tests: ordered and independent interpretation performed. Decision-making details documented in ED Course.  Risk Prescription drug management.   Lower abdominal pain with constipation nausea and vomiting.  Vital stable, no distress, abdomen soft without peritoneal signs.  Urinalysis is positive for blood but she is on her menses.  hCG is negative.  No leukocytosis.  LFTs and lipase are normal.  CT scan obtained in triage is negative for acute surgical pathology.  Results reviewed interpreted by me.  No small bowel obstruction.  No appendicitis.  Patient declines rectal exam but low suspicion for fecal impaction.  Pelvic ultrasound is negative for ovarian torsion or other acute abnormality.  Patient tolerating p.o.  Abdomen soft without peritoneal signs.  CT scan reassuring as above.  Will initiate MiraLAX for reported constipation though no large volume stool seen on imaging.  Patient declines rectal exam to assess for impaction.  Will treat symptomatically.  She is able to tolerate p.o.  PCP follow-up.  Return to the ED with new or worsening symptoms.  Discussed  early appendicitis is still possible she should return to the ED sooner if pain localizes to the right lower quadrant is associate with persistent vomiting or fever.       Final Clinical Impression(s) / ED Diagnoses Final diagnoses:  None    Rx / DC Orders ED Discharge Orders     None         Seven Dollens, Jeannett Senior, MD 05/13/22 [phone]

## 2022-05-15 ENCOUNTER — Encounter (HOSPITAL_COMMUNITY): Payer: Self-pay

## 2022-05-15 ENCOUNTER — Ambulatory Visit (HOSPITAL_COMMUNITY)
Admission: EM | Admit: 2022-05-15 | Discharge: 2022-05-15 | Disposition: A | Discharge status: Home / Self Care | Payer: Medicaid Other | Attending: Emergency Medicine | Admitting: Emergency Medicine

## 2022-05-15 DIAGNOSIS — J020 Streptococcal pharyngitis: Secondary | ICD-10-CM | POA: Diagnosis not present

## 2022-05-15 LAB — URINE CULTURE: Culture: 60000 — AB

## 2022-05-15 LAB — POCT RAPID STREP A, ED / UC: Streptococcus, Group A Screen (Direct): POSITIVE — AB

## 2022-05-15 MED ORDER — AMOXICILLIN 500 MG PO CAPS: 500.0000 mg | ORAL_CAPSULE | Freq: Two times a day (BID) | ORAL | 0 refills | Status: AC

## 2022-05-15 NOTE — ED Triage Notes (Signed)
Patient here today with c/o chills, dizziness, and pain on right side groin area X 3 days. She was in the hospital for abdominal pain a 3 days ago. She started having a ST and fever yesterday. She has tried Tylenol and IBU with no relief. Her boyfriend was sick a couple weeks ago with chills and sweats. No recent travel.

## 2022-05-15 NOTE — Discharge Instructions (Signed)
Treating you for strep throat . take antibiotic as directed until completed, do not save or share any.  Follow-up with PCP, return as needed

## 2022-05-15 NOTE — ED Provider Notes (Signed)
[facility]    CSN: [id number] Arrival date & time: 05/15/22  1038      History   Chief Complaint Chief Complaint  Patient presents with   Sore Throat    HPI Vicki Clayton is a 22 y.o. female.   22 year old female, Vicki Clayton, presents to urgent care with chief complaint of sore throat and fever that started yesterday.  Patient has tried Tylenol ibuprofen with no relief.  Boyfriend was recently sick, no recent travel.  The history is provided by the patient. No language interpreter was used.    Past Medical History:  Diagnosis Date   Anemia    Heart murmur of newborn    Pain in throat 09/02/2018   Syncope     Patient Active Problem List   Diagnosis Date Noted   Strep throat 05/15/2022   Pain in throat 09/02/2018    Past Surgical History:  Procedure Laterality Date   ELBOW CLOSED REDUCTION W/ PERCUANEOUS PINNING     ELBOW SURGERY     KNEE ARTHROSCOPY WITH ANTERIOR CRUCIATE LIGAMENT (ACL) REPAIR Left 05/30/2019   Procedure: KNEE ARTHROSCOPY WITH ANTERIOR CRUCIATE LIGAMENT (ACL) REPAIR WITH QUADRICEPS AUTOGRAFT;  Surgeon: Bjorn Pippin, MD;  Location: [facility];  Service: Orthopedics;  Laterality: Left;    OB History     Gravida  0   Para  0   Term  0   Preterm  0   AB  0   Living  0      SAB  0   IAB  0   Ectopic  0   Multiple  0   Live Births  0            Home Medications    Prior to Admission medications   Medication Sig Start Date End Date Taking? Authorizing Provider  albuterol (VENTOLIN HFA) 108 (90 Base) MCG/ACT inhaler Inhale 1 puff into the lungs every 6 (six) hours as needed for shortness of breath. 10/20/17  Yes [provider]  amoxicillin (AMOXIL) 500 MG capsule Take 1 capsule (500 mg total) by mouth 2 (two) times daily for 10 days. 05/15/22 05/25/22 Yes Nadir Vasques, Para March, NP  ondansetron (ZOFRAN) 4 MG tablet Take 1 tablet (4 mg total) by mouth every 8 (eight) hours as needed for  nausea or vomiting. 04/11/22  Yes Ward, Tylene Fantasia, PA-C  ondansetron (ZOFRAN-ODT) 4 MG disintegrating tablet Take 1 tablet (4 mg total) by mouth every 8 (eight) hours as needed for nausea or vomiting. 05/13/22  Yes Rancour, Jeannett Senior, MD  polyethylene glycol (MIRALAX) 17 g packet Take 17 g by mouth daily. 05/13/22  Yes Rancour, Jeannett Senior, MD  cetirizine (ZYRTEC) 10 MG tablet Take 1 tablet twice daily for the next 5 days, then resume taking 1 tablet daily Patient not taking: Reported on 05/12/2022 04/07/22   Dartha Lodge, PA-C  famotidine (PEPCID) 20 MG tablet Take 1 tablet (20 mg total) by mouth 2 (two) times daily for 5 days. 04/07/22 05/12/22  Dartha Lodge, PA-C  Guaifenesin 1200 MG TB12 Take 1 tablet (1,200 mg total) by mouth in the morning and at bedtime. Patient not taking: Reported on 05/12/2022 11/03/21   Carlisle Beers, FNP  medroxyPROGESTERone (DEPO-PROVERA) 150 MG/ML injection Inject 150 mg into the muscle every 3 (three) months.    [provider]  metroNIDAZOLE (FLAGYL) 500 MG tablet Take 1 tablet (500 mg total) by mouth 2 (two) times daily. Patient not taking: Reported on 05/12/2022 02/08/22  Merrilee Jansky, MD  traMADol (ULTRAM) 50 MG tablet Take 1 tablet (50 mg total) by mouth every 6 (six) hours as needed. Patient taking differently: Take 50 mg by mouth every 6 (six) hours as needed for moderate pain. 07/25/21   Valinda Hoar, NP    Family History Family History  Problem Relation Age of Onset   Diabetes Other    Hypertension Other    Cancer Other    Migraines Neg Hx    Seizures Neg Hx    Depression Neg Hx    Anxiety disorder Neg Hx    Bipolar disorder Neg Hx    Schizophrenia Neg Hx    ADD / ADHD Neg Hx    Autism Neg Hx     Social History Social History   Tobacco Use   Smoking status: Never   Smokeless tobacco: Never  Vaping Use   Vaping Use: Never used  Substance Use Topics   Alcohol use: No   Drug use: No     Allergies   Antihistamines,  diphenhydramine-type; Citrus; and Pineapple   Review of Systems Review of Systems  Constitutional:  Positive for fever.  HENT:  Positive for sore throat.   All other systems reviewed and are negative.    Physical Exam Triage Vital Signs ED Triage Vitals [05/15/22 1202]  Enc Vitals Group     BP 112/75     Pulse Rate 76     Resp 17     Temp 98.6 F (37 C)     Temp Source Oral     SpO2 96 %     Weight      Height  (1.499 m)     Head Circumference      Peak Flow      Pain Score 9     Pain Loc      Pain Edu?      Excl. in GC?    No data found.  Updated Vital Signs BP 112/75 (BP Location: Right Arm)   Pulse 76   Temp 98.6 F (37 C) (Oral)   Resp 17   Ht  (1.499 m)   LMP 05/11/2022   SpO2 96%   BMI 34.09 kg/m   Visual Acuity Right Eye Distance:   Left Eye Distance:   Bilateral Distance:    Right Eye Near:   Left Eye Near:    Bilateral Near:     Physical Exam Vitals and nursing note reviewed.  Constitutional:      Appearance: Normal appearance. She is well-developed and well-groomed.  HENT:     Head: Normocephalic.     Right Ear: Tympanic membrane is retracted.     Left Ear: Tympanic membrane is retracted.     Nose: Congestion present.     Mouth/Throat:     Lips: Pink.     Mouth: Mucous membranes are moist.     Pharynx: Oropharynx is clear. Posterior oropharyngeal erythema present.     Tonsils: No tonsillar exudate or tonsillar abscesses.     Comments: Tongue rings noted Eyes:     General: Lids are normal.     Extraocular Movements: Extraocular movements intact.     Pupils: Pupils are equal, round, and reactive to light.  Neck:     Trachea: Trachea normal.  Cardiovascular:     Rate and Rhythm: Normal rate and regular rhythm.     Pulses: Normal pulses.     Heart sounds: Normal heart sounds.  Pulmonary:  Effort: Pulmonary effort is normal.     Breath sounds: Normal breath sounds and air entry.  Musculoskeletal:     Cervical back:  Normal range of motion.  Neurological:     General: No focal deficit present.     Mental Status: She is alert and oriented to person, place, and time.     GCS: GCS eye subscore is 4. GCS verbal subscore is 5. GCS motor subscore is 6.  Psychiatric:        Attention and Perception: Attention normal.        Mood and Affect: Mood normal.        Speech: Speech normal.        Behavior: Behavior normal. Behavior is cooperative.      UC Treatments / Results  Labs (all labs ordered are listed, but only abnormal results are displayed) Labs Reviewed  POCT RAPID STREP A, ED / UC - Abnormal; Notable for the following components:      Result Value   Streptococcus, Group A Screen (Direct) POSITIVE (*)    All other components within normal limits    EKG   Radiology No results found.  Procedures Procedures (including critical care time)  Medications Ordered in UC Medications - No data to display  Initial Impression / Assessment and Plan / UC Course  I have reviewed the triage vital signs and the nursing notes.  Pertinent labs & imaging results that were available during my care of the patient were reviewed by me and considered in my medical decision making (see chart for details).    Patient verbalized understanding to this provider regarding plan of care  Strep pharyngitis, viral illness, allergies Final Clinical Impressions(s) / UC Diagnoses   Final diagnoses:  Strep throat     Discharge Instructions      Treating you for strep throat . take antibiotic as directed until completed, do not save or share any.  Follow-up with PCP, return as needed    ED Prescriptions     Medication Sig Dispense Auth. Provider   amoxicillin (AMOXIL) 500 MG capsule Take 1 capsule (500 mg total) by mouth 2 (two) times daily for 10 days. 20 capsule Michaelle Bottomley, Para March, NP      PDMP not reviewed this encounter.   Clancy Gourd, NP 05/15/22 1417

## 2022-05-16 ENCOUNTER — Ambulatory Visit: Payer: Medicaid Other | Attending: Physician Assistant

## 2022-05-19 NOTE — Therapy (Deleted)
OUTPATIENT PHYSICAL THERAPY TREATMENT NOTE   Patient Name: Vicki Clayton MRN: [id number] DOB:2000-07-25, 22 y.o., female Today's Date: 05/19/2022  PCP: Norm Salt, PA  REFERRING PROVIDER: Norm Salt, PA   END OF SESSION:    Past Medical History:  Diagnosis Date   Anemia    Heart murmur of newborn    Pain in throat 09/02/2018   Syncope    Past Surgical History:  Procedure Laterality Date   ELBOW CLOSED REDUCTION W/ PERCUANEOUS PINNING     ELBOW SURGERY     KNEE ARTHROSCOPY WITH ANTERIOR CRUCIATE LIGAMENT (ACL) REPAIR Left 05/30/2019   Procedure: KNEE ARTHROSCOPY WITH ANTERIOR CRUCIATE LIGAMENT (ACL) REPAIR WITH QUADRICEPS AUTOGRAFT;  Surgeon: Bjorn Pippin, MD;  Location: [facility];  Service: Orthopedics;  Laterality: Left;   Patient Active Problem List   Diagnosis Date Noted   Strep throat 05/15/2022   Pain in throat 09/02/2018    REFERRING DIAG: upper back pain   THERAPY DIAG: upper back pain Postural dysfunction   Rationale for Evaluation and Treatment Rehabilitation  PERTINENT HISTORY: none   PRECAUTIONS: none   SUBJECTIVE:                                                                                                                                                                                      SUBJECTIVE STATEMENT:  ***   PAIN:  Are you having pain? {OPRCPAIN:[id number]}   OBJECTIVE: (objective measures completed at initial evaluation unless otherwise dated)   DIAGNOSTIC FINDINGS:  none   PATIENT SURVEYS:  NDI 19/50(38% disability)   SCREENING FOR RED FLAGS: negative   POSTURE: rounded shoulders, forward head, and increased thoracic kyphosis   PALPATION: TTP B upper traps/levators   LUMBAR ROM:    AROM eval  Flexion 100%  Extension 100%  Right lateral flexion 100%  Left lateral flexion 100%  Right rotation 100%  Left rotation 100%   (Blank rows = not tested)   CERVICAL ROM:     AROM A/PROM  (deg) 04/22/2022  Flexion 75%  Extension 75%  Right lateral flexion 75%  Left lateral flexion 75%  Right rotation 90%  Left rotation 75%   (Blank rows = not tested)   UE ROM: WNL throughout   AROM Right 04/22/2022 Left 04/22/2022  Shoulder flexion      Shoulder extension      Shoulder abduction      Shoulder adduction      Shoulder extension      Shoulder internal rotation      Shoulder external rotation      Elbow flexion      Elbow extension  Wrist flexion      Wrist extension      Wrist ulnar deviation      Wrist radial deviation      Wrist pronation      Wrist supination       (Blank rows = not tested)   UE MMT: WNL throughout   MMT Right 04/22/2022 Left 04/22/2022  Shoulder flexion      Shoulder extension      Shoulder abduction      Shoulder adduction      Shoulder extension      Shoulder internal rotation      Shoulder external rotation      Middle trapezius      Lower trapezius      Elbow flexion      Elbow extension      Wrist flexion      Wrist extension      Wrist ulnar deviation      Wrist radial deviation      Wrist pronation      Wrist supination      Grip strength       (Blank rows = not tested)   LUMBAR SPECIAL TESTS:  deferred   FUNCTIONAL TESTS:  5 times sit to stand: 7s arms crossed   GAIT: Distance walked: 68ft x2 Assistive device utilized: None Level of assistance: Complete Independence Comments: unremarkable   TODAY'S TREATMENT:                                                                                                                              DATE: Eval      PATIENT EDUCATION:  Education details: Discussed eval findings, rehab rationale and POC and patient is in agreement  Person educated: Patient Education method: Explanation Education comprehension: verbalized understanding and needs further education   HOME EXERCISE PROGRAM: Access Code: [email or web address] URL: [email or web address] Date:  04/22/2022 Prepared by: Gustavus Bryant   Exercises - Seated Levator Scapulae Stretch on Wall  - 2 x daily - 5 x weekly - 1 sets - 2 reps - 30s hold - Seated Upper Trap Stretch  - 2 x daily - 5 x weekly - 1 sets - 2 reps - 30s hold   ASSESSMENT:   CLINICAL IMPRESSION: Patient is a 22 y.o. female who was seen today for physical therapy evaluation and treatment for chronic upper back pain. Patient presents with mild restrictions in cervical mobility but full UE AROM and strength.  Palpation finds tenderness and trigger points in B upper traps and levator regions.  UE paresthesias reproduced with upper trap and levator stretches.  Postural deficits of forward and rounded shoulders observed.   OBJECTIVE IMPAIRMENTS: decreased activity tolerance, decreased knowledge of condition, decreased mobility, decreased ROM, increased fascial restrictions, and pain.    ACTIVITY LIMITATIONS: carrying, lifting, sitting, and standing   PERSONAL FACTORS: Age and Time since onset of injury/illness/exacerbation are also affecting patient's functional outcome.    REHAB  POTENTIAL: Good   CLINICAL DECISION MAKING: Stable/uncomplicated   EVALUATION COMPLEXITY: Low     GOALS: Goals reviewed with patient? Yes   SHORT TERM GOALS+LONG TERM GOALS: Target date: 06/03/2022   Patient to demonstrate independence in HEP  Baseline: [id number] Goal status: INITIAL   2.  Decrease pain to 6/10 Baseline:  Goal status: INITIAL   3.  Increase AROM C-spine to 90% Baseline:   AROM A/PROM (deg) 04/22/2022  Flexion 75%  Extension 75%  Right lateral flexion 75%  Left lateral flexion 75%  Right rotation 90%  Left rotation 75%    Goal status: INITIAL   4.  Decrease worst pain to 6/10 Baseline: 9/10 Goal status: INITIAL   5.  Decrease NDI score to 10/50 Baseline: 19/50 Goal status: INITIAL     PLAN:   PT FREQUENCY: 1x/week   PT DURATION: 6 weeks   PLANNED INTERVENTIONS: Therapeutic exercises, Therapeutic  activity, Neuromuscular re-education, Balance training, Gait training, Patient/Family education, Self Care, Joint mobilization, Dry Needling, Spinal mobilization, Manual therapy, and Re-evaluation.   PLAN FOR NEXT SESSION: HEP review and update, postural training, strengthening and flexibility tasks, aerobic work, manual as needed including TPDN   Hildred Laser, PT 05/19/2022, 2:34 PM

## 2022-05-23 ENCOUNTER — Ambulatory Visit: Payer: Medicaid Other

## 2022-05-26 ENCOUNTER — Ambulatory Visit: Payer: Medicaid Other | Admitting: Student

## 2022-05-29 NOTE — Therapy (Deleted)
OUTPATIENT PHYSICAL THERAPY TREATMENT NOTE   Patient Name: Vicki Clayton MRN: [id number] DOB:03/18/2000, 22 y.o., female Today's Date: 05/19/2022  PCP: Vanstory, Ashley N, PA  REFERRING PROVIDER: Vanstory, Ashley N, PA   END OF SESSION:    Past Medical History:  Diagnosis Date   Anemia    Heart murmur of newborn    Pain in throat 09/02/2018   Syncope    Past Surgical History:  Procedure Laterality Date   ELBOW CLOSED REDUCTION W/ PERCUANEOUS PINNING     ELBOW SURGERY     KNEE ARTHROSCOPY WITH ANTERIOR CRUCIATE LIGAMENT (ACL) REPAIR Left 05/30/2019   Procedure: KNEE ARTHROSCOPY WITH ANTERIOR CRUCIATE LIGAMENT (ACL) REPAIR WITH QUADRICEPS AUTOGRAFT;  Surgeon: Varkey, Dax T, MD;  Location: [facility];  Service: Orthopedics;  Laterality: Left;   Patient Active Problem List   Diagnosis Date Noted   Strep throat 05/15/2022   Pain in throat 09/02/2018    REFERRING DIAG: upper back pain   THERAPY DIAG: upper back pain Postural dysfunction   Rationale for Evaluation and Treatment Rehabilitation  PERTINENT HISTORY: none   PRECAUTIONS: none   SUBJECTIVE:                                                                                                                                                                                      SUBJECTIVE STATEMENT:  ***   PAIN:  Are you having pain? {OPRCPAIN:[id number]}   OBJECTIVE: (objective measures completed at initial evaluation unless otherwise dated)   DIAGNOSTIC FINDINGS:  none   PATIENT SURVEYS:  NDI 19/50(38% disability)   SCREENING FOR RED FLAGS: negative   POSTURE: rounded shoulders, forward head, and increased thoracic kyphosis   PALPATION: TTP B upper traps/levators   LUMBAR ROM:    AROM eval  Flexion 100%  Extension 100%  Right lateral flexion 100%  Left lateral flexion 100%  Right rotation 100%  Left rotation 100%   (Blank rows = not tested)   CERVICAL ROM:     AROM A/PROM  (deg) 04/22/2022  Flexion 75%  Extension 75%  Right lateral flexion 75%  Left lateral flexion 75%  Right rotation 90%  Left rotation 75%   (Blank rows = not tested)   UE ROM: WNL throughout   AROM Right 04/22/2022 Left 04/22/2022  Shoulder flexion      Shoulder extension      Shoulder abduction      Shoulder adduction      Shoulder extension      Shoulder internal rotation      Shoulder external rotation      Elbow flexion      Elbow extension        Wrist flexion      Wrist extension      Wrist ulnar deviation      Wrist radial deviation      Wrist pronation      Wrist supination       (Blank rows = not tested)   UE MMT: WNL throughout   MMT Right 04/22/2022 Left 04/22/2022  Shoulder flexion      Shoulder extension      Shoulder abduction      Shoulder adduction      Shoulder extension      Shoulder internal rotation      Shoulder external rotation      Middle trapezius      Lower trapezius      Elbow flexion      Elbow extension      Wrist flexion      Wrist extension      Wrist ulnar deviation      Wrist radial deviation      Wrist pronation      Wrist supination      Grip strength       (Blank rows = not tested)   LUMBAR SPECIAL TESTS:  deferred   FUNCTIONAL TESTS:  5 times sit to stand: 7s arms crossed   GAIT: Distance walked: 75ft x2 Assistive device utilized: None Level of assistance: Complete Independence Comments: unremarkable   TODAY'S TREATMENT:                                                                                                                              DATE: Eval      PATIENT EDUCATION:  Education details: Discussed eval findings, rehab rationale and POC and patient is in agreement  Person educated: Patient Education method: Explanation Education comprehension: verbalized understanding and needs further education   HOME EXERCISE PROGRAM: Access Code: [email or web address] URL: [email or web address] Date:  04/22/2022 Prepared by: Cranford Blessinger   Exercises - Seated Levator Scapulae Stretch on Wall  - 2 x daily - 5 x weekly - 1 sets - 2 reps - 30s hold - Seated Upper Trap Stretch  - 2 x daily - 5 x weekly - 1 sets - 2 reps - 30s hold   ASSESSMENT:   CLINICAL IMPRESSION: Patient is a 21 y.o. female who was seen today for physical therapy evaluation and treatment for chronic upper back pain. Patient presents with mild restrictions in cervical mobility but full UE AROM and strength.  Palpation finds tenderness and trigger points in B upper traps and levator regions.  UE paresthesias reproduced with upper trap and levator stretches.  Postural deficits of forward and rounded shoulders observed.   OBJECTIVE IMPAIRMENTS: decreased activity tolerance, decreased knowledge of condition, decreased mobility, decreased ROM, increased fascial restrictions, and pain.    ACTIVITY LIMITATIONS: carrying, lifting, sitting, and standing   PERSONAL FACTORS: Age and Time since onset of injury/illness/exacerbation are also affecting patient's functional outcome.    REHAB   POTENTIAL: Good   CLINICAL DECISION MAKING: Stable/uncomplicated   EVALUATION COMPLEXITY: Low     GOALS: Goals reviewed with patient? Yes   SHORT TERM GOALS+LONG TERM GOALS: Target date: 06/03/2022   Patient to demonstrate independence in HEP  Baseline: [id number] Goal status: INITIAL   2.  Decrease pain to 6/10 Baseline:  Goal status: INITIAL   3.  Increase AROM C-spine to 90% Baseline:   AROM A/PROM (deg) 04/22/2022  Flexion 75%  Extension 75%  Right lateral flexion 75%  Left lateral flexion 75%  Right rotation 90%  Left rotation 75%    Goal status: INITIAL   4.  Decrease worst pain to 6/10 Baseline: 9/10 Goal status: INITIAL   5.  Decrease NDI score to 10/50 Baseline: 19/50 Goal status: INITIAL     PLAN:   PT FREQUENCY: 1x/week   PT DURATION: 6 weeks   PLANNED INTERVENTIONS: Therapeutic exercises, Therapeutic  activity, Neuromuscular re-education, Balance training, Gait training, Patient/Family education, Self Care, Joint mobilization, Dry Needling, Spinal mobilization, Manual therapy, and Re-evaluation.   PLAN FOR NEXT SESSION: HEP review and update, postural training, strengthening and flexibility tasks, aerobic work, manual as needed including TPDN   Enez Monahan M Aditri Louischarles, PT 05/19/2022, 2:34 PM     

## 2022-05-30 ENCOUNTER — Ambulatory Visit: Payer: Medicaid Other

## 2022-06-06 ENCOUNTER — Ambulatory Visit: Payer: Medicaid Other

## 2022-06-09 ENCOUNTER — Ambulatory Visit: Payer: Medicaid Other | Attending: Physician Assistant

## 2022-06-09 NOTE — Therapy (Deleted)
OUTPATIENT PHYSICAL THERAPY TREATMENT NOTE   Patient Name: Vicki Clayton MRN: [id number] DOB:[birth year], 22 y.o., female Today's Date: 06/09/2022  PCP: Norm Salt, PA  REFERRING PROVIDER: Norm Salt, PA   END OF SESSION:    Past Medical History:  Diagnosis Date   Anemia    Heart murmur of newborn    Pain in throat 09/02/2018   Syncope    Past Surgical History:  Procedure Laterality Date   ELBOW CLOSED REDUCTION W/ PERCUANEOUS PINNING     ELBOW SURGERY     KNEE ARTHROSCOPY WITH ANTERIOR CRUCIATE LIGAMENT (ACL) REPAIR Left 05/30/2019   Procedure: KNEE ARTHROSCOPY WITH ANTERIOR CRUCIATE LIGAMENT (ACL) REPAIR WITH QUADRICEPS AUTOGRAFT;  Surgeon: Bjorn Pippin, MD;  Location: [facility];  Service: Orthopedics;  Laterality: Left;   Patient Active Problem List   Diagnosis Date Noted   Strep throat 05/15/2022   Pain in throat 09/02/2018    REFERRING DIAG: upper back pain   THERAPY DIAG:  No diagnosis found.  Rationale for Evaluation and Treatment Rehabilitation  PERTINENT HISTORY: none   PRECAUTIONS: none   SUBJECTIVE:                                                                                                                                                                                      SUBJECTIVE STATEMENT:  ***   PAIN:  Are you having pain? {[id number]}   OBJECTIVE: (objective measures completed at initial evaluation unless otherwise dated)   DIAGNOSTIC FINDINGS:  none   PATIENT SURVEYS:  NDI 19/50(38% disability)   SCREENING FOR RED FLAGS: negative   POSTURE: rounded shoulders, forward head, and increased thoracic kyphosis   PALPATION: TTP B upper traps/levators   LUMBAR ROM:    AROM eval  Flexion 100%  Extension 100%  Right lateral flexion 100%  Left lateral flexion 100%  Right rotation 100%  Left rotation 100%   (Blank rows = not tested)   CERVICAL ROM:     AROM A/PROM (deg) 04/22/2022   Flexion 75%  Extension 75%  Right lateral flexion 75%  Left lateral flexion 75%  Right rotation 90%  Left rotation 75%   (Blank rows = not tested)   UE ROM: WNL throughout   AROM Right 04/22/2022 Left 04/22/2022  Shoulder flexion      Shoulder extension      Shoulder abduction      Shoulder adduction      Shoulder extension      Shoulder internal rotation      Shoulder external rotation      Elbow flexion      Elbow extension  Wrist flexion      Wrist extension      Wrist ulnar deviation      Wrist radial deviation      Wrist pronation      Wrist supination       (Blank rows = not tested)   UE MMT: WNL throughout   MMT Right 04/22/2022 Left 04/22/2022  Shoulder flexion      Shoulder extension      Shoulder abduction      Shoulder adduction      Shoulder extension      Shoulder internal rotation      Shoulder external rotation      Middle trapezius      Lower trapezius      Elbow flexion      Elbow extension      Wrist flexion      Wrist extension      Wrist ulnar deviation      Wrist radial deviation      Wrist pronation      Wrist supination      Grip strength       (Blank rows = not tested)   LUMBAR SPECIAL TESTS:  deferred   FUNCTIONAL TESTS:  5 times sit to stand: 7s arms crossed   GAIT: Distance walked: 12ft x2 Assistive device utilized: None Level of assistance: Complete Independence Comments: unremarkable   TODAY'S TREATMENT:                                                                                                                              DATE: Eval      PATIENT EDUCATION:  Education details: Discussed eval findings, rehab rationale and POC and patient is in agreement  Person educated: Patient Education method: Explanation Education comprehension: verbalized understanding and needs further education   HOME EXERCISE PROGRAM: Access Code: [email or web address] URL: [email or web address] Date: 04/22/2022 Prepared by:  Gustavus Bryant   Exercises - Seated Levator Scapulae Stretch on Wall  - 2 x daily - 5 x weekly - 1 sets - 2 reps - 30s hold - Seated Upper Trap Stretch  - 2 x daily - 5 x weekly - 1 sets - 2 reps - 30s hold   ASSESSMENT:   CLINICAL IMPRESSION: Patient is a 22 y.o. female who was seen today for physical therapy evaluation and treatment for chronic upper back pain. Patient presents with mild restrictions in cervical mobility but full UE AROM and strength.  Palpation finds tenderness and trigger points in B upper traps and levator regions.  UE paresthesias reproduced with upper trap and levator stretches.  Postural deficits of forward and rounded shoulders observed.   OBJECTIVE IMPAIRMENTS: decreased activity tolerance, decreased knowledge of condition, decreased mobility, decreased ROM, increased fascial restrictions, and pain.    ACTIVITY LIMITATIONS: carrying, lifting, sitting, and standing   PERSONAL FACTORS: Age and Time since onset of injury/illness/exacerbation are also affecting patient's functional outcome.    REHAB  POTENTIAL: Good   CLINICAL DECISION MAKING: Stable/uncomplicated   EVALUATION COMPLEXITY: Low     GOALS: Goals reviewed with patient? Yes   SHORT TERM GOALS+LONG TERM GOALS: Target date: 06/03/2022   Patient to demonstrate independence in HEP  Baseline: [id number] Goal status: INITIAL   2.  Decrease pain to 6/10 Baseline:  Goal status: INITIAL   3.  Increase AROM C-spine to 90% Baseline:   AROM A/PROM (deg) 04/22/2022  Flexion 75%  Extension 75%  Right lateral flexion 75%  Left lateral flexion 75%  Right rotation 90%  Left rotation 75%    Goal status: INITIAL   4.  Decrease worst pain to 6/10 Baseline: 9/10 Goal status: INITIAL   5.  Decrease NDI score to 10/50 Baseline: 19/50 Goal status: INITIAL     PLAN:   PT FREQUENCY: 1x/week   PT DURATION: 6 weeks   PLANNED INTERVENTIONS: Therapeutic exercises, Therapeutic activity, Neuromuscular  re-education, Balance training, Gait training, Patient/Family education, Self Care, Joint mobilization, Dry Needling, Spinal mobilization, Manual therapy, and Re-evaluation.   PLAN FOR NEXT SESSION: HEP review and update, postural training, strengthening and flexibility tasks, aerobic work, manual as needed including TPDN   Hildred Laser, PT 06/09/2022, 11:44 AM

## 2022-07-04 ENCOUNTER — Ambulatory Visit (INDEPENDENT_AMBULATORY_CARE_PROVIDER_SITE_OTHER): Payer: Medicaid Other

## 2022-07-04 ENCOUNTER — Ambulatory Visit
Admission: EM | Admit: 2022-07-04 | Discharge: 2022-07-04 | Disposition: A | Discharge status: Home / Self Care | Payer: Medicaid Other | Attending: Family Medicine | Admitting: Family Medicine

## 2022-07-04 DIAGNOSIS — S99921A Unspecified injury of right foot, initial encounter: Secondary | ICD-10-CM | POA: Diagnosis not present

## 2022-07-04 DIAGNOSIS — S99911A Unspecified injury of right ankle, initial encounter: Secondary | ICD-10-CM

## 2022-07-04 MED ORDER — NAPROXEN 375 MG PO TABS: 375.0000 mg | ORAL_TABLET | Freq: Two times a day (BID) | ORAL | 0 refills | Status: DC

## 2022-07-04 MED ORDER — CYCLOBENZAPRINE HCL 10 MG PO TABS: 10.0000 mg | ORAL_TABLET | Freq: Three times a day (TID) | ORAL | 0 refills | Status: DC | PRN

## 2022-07-04 NOTE — ED Triage Notes (Signed)
Patient here today for a right foot injury yesterday. She was standing on something and jumped down and foot went into a hole in the ground. She is now having increased pain with weightbearing, swelling, and bruising. She iced it. She took Tylenol last night but did not help.

## 2022-07-04 NOTE — ED Provider Notes (Signed)
[name]    CSN: [id number] Arrival date & time: 07/04/22  1006      History   Chief Complaint Chief Complaint  Patient presents with   Foot Injury    HPI Vicki Clayton Fell is a 22 y.o. female.   HPI Patient here to for evaluation injury involving right foot.  Patient reports that she was standing yesterday and jumped down and when she jumped her foot went into a hole that was in the ground.  She endorses twisting her foot long with immediate pain with weightbearing.  She is still having pain mostly to her foot although pain occasionally shoots up to her ankle.  She has iced her foot and taking Tylenol without much relief overnight.   Past Medical History:  Diagnosis Date   Anemia    Heart murmur of newborn    Pain in throat 09/02/2018   Syncope     Patient Active Problem List   Diagnosis Date Noted   Strep throat 05/15/2022   Pain in throat 09/02/2018    Past Surgical History:  Procedure Laterality Date   ELBOW CLOSED REDUCTION W/ PERCUANEOUS PINNING     ELBOW SURGERY     KNEE ARTHROSCOPY WITH ANTERIOR CRUCIATE LIGAMENT (ACL) REPAIR Left 05/30/2019   Procedure: KNEE ARTHROSCOPY WITH ANTERIOR CRUCIATE LIGAMENT (ACL) REPAIR WITH QUADRICEPS AUTOGRAFT;  Surgeon: Bjorn Pippin, MD;  Location: [facility];  Service: Orthopedics;  Laterality: Left;    OB History     Gravida  0   Para  0   Term  0   Preterm  0   AB  0   Living  0      SAB  0   IAB  0   Ectopic  0   Multiple  0   Live Births  0            Home Medications    Prior to Admission medications   Medication Sig Start Date End Date Taking? Authorizing Provider  cyclobenzaprine (FLEXERIL) 10 MG tablet Take 1 tablet (10 mg total) by mouth 3 (three) times daily as needed for muscle spasms. 07/04/22  Yes Bing Neighbors, NP  naproxen (NAPROSYN) 375 MG tablet Take 1 tablet (375 mg total) by mouth 2 (two) times daily. 07/04/22  Yes Bing Neighbors, NP   albuterol (VENTOLIN HFA) 108 (90 Base) MCG/ACT inhaler Inhale 1 puff into the lungs every 6 (six) hours as needed for shortness of breath. 10/20/17   [provider]  cetirizine (ZYRTEC) 10 MG tablet Take 1 tablet twice daily for the next 5 days, then resume taking 1 tablet daily Patient not taking: Reported on 05/12/2022 04/07/22   Dartha Lodge, PA-C  famotidine (PEPCID) 20 MG tablet Take 1 tablet (20 mg total) by mouth 2 (two) times daily for 5 days. 04/07/22 05/12/22  Dartha Lodge, PA-C  Guaifenesin 1200 MG TB12 Take 1 tablet (1,200 mg total) by mouth in the morning and at bedtime. Patient not taking: Reported on 05/12/2022 11/03/21   Carlisle Beers, FNP  medroxyPROGESTERone (DEPO-PROVERA) 150 MG/ML injection Inject 150 mg into the muscle every 3 (three) months.    [provider]  metroNIDAZOLE (FLAGYL) 500 MG tablet Take 1 tablet (500 mg total) by mouth 2 (two) times daily. Patient not taking: Reported on 05/12/2022 02/08/22   Merrilee Jansky, MD  ondansetron (ZOFRAN) 4 MG tablet Take 1 tablet (4 mg total) by mouth every 8 (eight) hours as  needed for nausea or vomiting. 04/11/22   Ward, Tylene Fantasia, PA-C  ondansetron (ZOFRAN-ODT) 4 MG disintegrating tablet Take 1 tablet (4 mg total) by mouth every 8 (eight) hours as needed for nausea or vomiting. 05/13/22   Rancour, Jeannett Senior, MD  polyethylene glycol (MIRALAX) 17 g packet Take 17 g by mouth daily. 05/13/22   Rancour, Jeannett Senior, MD  traMADol (ULTRAM) 50 MG tablet Take 1 tablet (50 mg total) by mouth every 6 (six) hours as needed. Patient taking differently: Take 50 mg by mouth every 6 (six) hours as needed for moderate pain. 07/25/21   Valinda Hoar, NP    Family History Family History  Problem Relation Age of Onset   Diabetes Other    Hypertension Other    Cancer Other    Migraines Neg Hx    Seizures Neg Hx    Depression Neg Hx    Anxiety disorder Neg Hx    Bipolar disorder Neg Hx    Schizophrenia Neg Hx    ADD / ADHD  Neg Hx    Autism Neg Hx     Social History Social History   Tobacco Use   Smoking status: Never   Smokeless tobacco: Never  Vaping Use   Vaping Use: Never used  Substance Use Topics   Alcohol use: No   Drug use: No     Allergies   Antihistamines, diphenhydramine-type; Citrus; and Pineapple   Review of Systems Review of Systems Pertinent negatives listed in HPI  Physical Exam Triage Vital Signs ED Triage Vitals  Enc Vitals Group     BP 07/04/22 1219 107/74     Pulse Rate 07/04/22 1219 78     Resp 07/04/22 1219 18     Temp 07/04/22 1219 98 F (36.7 C)     Temp Source 07/04/22 1219 Oral     SpO2 07/04/22 1219 97 %     Weight --      Height 07/04/22 1219 4' 11.5" (1.511 m)     Head Circumference --      Peak Flow --      Pain Score 07/04/22 1218 8     Pain Loc --      Pain Edu? --      Excl. in GC? --    No data found.  Updated Vital Signs BP 107/74 (BP Location: Left Arm)   Pulse 78   Temp 98 F (36.7 C) (Oral)   Resp 18   Ht 4' 11.5" (1.511 m)   SpO2 97%   BMI 33.52 kg/m   Visual Acuity Right Eye Distance:   Left Eye Distance:   Bilateral Distance:    Right Eye Near:   Left Eye Near:    Bilateral Near:     Physical Exam Vitals reviewed.  Constitutional:      Appearance: Normal appearance.  HENT:     Head: Normocephalic and atraumatic.  Cardiovascular:     Rate and Rhythm: Normal rate and regular rhythm.  Pulmonary:     Effort: Pulmonary effort is normal.     Breath sounds: Normal breath sounds and air entry.  Musculoskeletal:     Right ankle: Swelling and ecchymosis present. Tenderness present over the lateral malleolus and posterior TF ligament. No medial malleolus tenderness. Decreased range of motion.     Right Achilles Tendon: Tenderness present.     Left ankle: Normal.     Right foot: Swelling present.     Left foot: Normal.  Skin:  Capillary Refill: Capillary refill takes less than 2 seconds.  Neurological:     Mental  Status: She is alert.      UC Treatments / Results  Labs (all labs ordered are listed, but only abnormal results are displayed) Labs Reviewed - No data to display  EKG   Radiology DG Foot Complete Right  Result Date: 07/04/2022 CLINICAL DATA:  Right foot and ankle swelling and limited range of motion after stepping into a hole. EXAM: RIGHT FOOT COMPLETE - 3+ VIEW COMPARISON:  07/16/2013 FINDINGS: Normal bones and soft tissues.  No fracture or dislocation seen. IMPRESSION: Negative. Electronically Signed   By: Beckie Salts M.D.   On: 07/04/2022 12:48   DG Ankle Complete Right  Result Date: 07/04/2022 CLINICAL DATA:  Jumped in foot 1 into a hole.  Pain and swelling. EXAM: RIGHT ANKLE - COMPLETE 3+ VIEW COMPARISON:  None Available. FINDINGS: There is no evidence of fracture, dislocation, or joint effusion. There is no evidence of arthropathy or other focal bone abnormality. Soft tissues are unremarkable. IMPRESSION: Negative. Electronically Signed   By: Kennith Center M.D.   On: 07/04/2022 12:47    Procedures Procedures (including critical care time)  Medications Ordered in UC Medications - No data to display  Initial Impression / Assessment and Plan / UC Course  I have reviewed the triage vital signs and the nursing notes.  Pertinent labs & imaging results that were available during my care of the patient were reviewed by me and considered in my medical decision making (see chart for details).    Of right ankle and right foot unremarkable however has appreciable swelling at the Achilles tendon and distal ankle region of the left foot.  I suspect patient may have had a tendon tear.  Placing patient in ASO lace up for ankle stabilization and crutches.  Advised her to remain nonweightbearing and to follow-up with orthopedics in their walk-in clinic today or tomorrow. Final Clinical Impressions(s) / UC Diagnoses   Final diagnoses:  Injury of right ankle, initial encounter      Discharge Instructions      You must follow-up with orthopedics in order to have clearance to return to work.  I suspect you may have a tendon rupture which is unable to be seen x-ray.  Continue to remain nonweightbearing until medically cleared by Ortho. For pain I have prescribed you Naprosyn take 1 tablet up to twice daily as needed for pain and this will help with swelling.  For acute pain prescribed cyclobenzaprine which you can take up to 3 times daily.  Refrain from driving while taking this medication as it can cause severe drowsiness.     ED Prescriptions     Medication Sig Dispense Auth. Provider   naproxen (NAPROSYN) 375 MG tablet Take 1 tablet (375 mg total) by mouth 2 (two) times daily. 20 tablet Bing Neighbors, NP   cyclobenzaprine (FLEXERIL) 10 MG tablet Take 1 tablet (10 mg total) by mouth 3 (three) times daily as needed for muscle spasms. 20 tablet Bing Neighbors, NP      PDMP not reviewed this encounter.   Bing Neighbors, NP 07/05/22 [phone]

## 2022-07-04 NOTE — Discharge Instructions (Addendum)
You must follow-up with orthopedics in order to have clearance to return to work.  I suspect you may have a tendon rupture which is unable to be seen x-ray.  Continue to remain nonweightbearing until medically cleared by Ortho. For pain I have prescribed you Naprosyn take 1 tablet up to twice daily as needed for pain and this will help with swelling.  For acute pain prescribed cyclobenzaprine which you can take up to 3 times daily.  Refrain from driving while taking this medication as it can cause severe drowsiness.

## 2022-07-18 ENCOUNTER — Telehealth: Payer: Self-pay | Admitting: *Deleted

## 2022-07-18 NOTE — Telephone Encounter (Signed)
MyChart message to patient about missing PT notes beyond first PT eval.

## 2022-08-08 NOTE — Telephone Encounter (Signed)
Patient not yet responded about missing PT treatments, yet message shows as read as of 07/18/22. Will not submit to insurance for [facility] Medicaid [place] without 6 weeks PT treatment. Per scanned doc from 05/09/22 patient was approved for 8 Physical Therapy Treatments that would further support breast reduction surgery should physical therapy prove to be a conservative failed treatment. Moving patient to the "waiting on patient tab on the AAA"

## 2022-08-17 ENCOUNTER — Other Ambulatory Visit: Payer: Self-pay | Admitting: Physician Assistant

## 2022-08-17 ENCOUNTER — Encounter: Payer: Self-pay | Admitting: Physician Assistant

## 2022-08-17 DIAGNOSIS — N644 Mastodynia: Secondary | ICD-10-CM

## 2022-12-26 ENCOUNTER — Telehealth: Payer: Self-pay

## 2022-12-27 ENCOUNTER — Encounter: Payer: Medicaid Other | Admitting: Family Medicine

## 2023-01-30 ENCOUNTER — Ambulatory Visit
Admission: EM | Admit: 2023-01-30 | Discharge: 2023-01-30 | Disposition: A | Discharge status: Home / Self Care | Payer: Medicaid Other

## 2023-01-30 DIAGNOSIS — J019 Acute sinusitis, unspecified: Secondary | ICD-10-CM | POA: Diagnosis not present

## 2023-01-30 DIAGNOSIS — J069 Acute upper respiratory infection, unspecified: Secondary | ICD-10-CM

## 2023-01-30 MED ORDER — AMOXICILLIN-POT CLAVULANATE 875-125 MG PO TABS: 1.0000 | ORAL_TABLET | Freq: Two times a day (BID) | ORAL | 0 refills | Status: DC

## 2023-01-30 NOTE — ED Triage Notes (Signed)
"  This started with sore throat on Monday a week ago, then chills (cold/hot), Sore throat is improving but the Cough now is persistent and non productive". "I feel like I can't breath at times and head hurts on right side of my head". Fever "up to 100.9, last night".

## 2023-02-01 NOTE — L&D Delivery Note (Signed)
 Faculty Note  In to room for several decels on the monitor, on my arrival, RN just checking patient and she was found to be 10/100/+3. I gowned and glove and patient delivered with 2 pushes a viable female infant from ROA position under epidural analgesia. Head delivered, no nuchal cord.  Shoulders and body delivered atraumatically. Meconium noted. Infant not vigorous from delivery so cord clamped and cut shortly after delivery and taken to warmer for staff. . Nose and mouth bulb suctioned.   Cord blood obtained and cord pH obtained. Placenta delivered spontaneously and intact, 3VC. Bilateral periurethral lacerations and posterior right vaginal wall laceration noted, repaired with 2-0 and 3-0 Vicryl in usual fashion. Red rubber catheter placed to show patency of urethra and rectal exam performed demonstrated no sutures through to the rectum. Weight pending, Apgars pending. EBL: 450 mL.    LOIS Yolanda Moats, MD, [facility] Attending Center for [employer] [facility])

## 2023-02-07 ENCOUNTER — Encounter: Payer: Medicaid Other | Admitting: Family Medicine

## 2023-03-22 ENCOUNTER — Encounter (HOSPITAL_COMMUNITY): Payer: Self-pay

## 2023-03-22 ENCOUNTER — Ambulatory Visit (HOSPITAL_COMMUNITY)
Admission: EM | Admit: 2023-03-22 | Discharge: 2023-03-22 | Disposition: A | Discharge status: Home / Self Care | Payer: Medicaid Other | Attending: Emergency Medicine | Admitting: Emergency Medicine

## 2023-03-22 DIAGNOSIS — R110 Nausea: Secondary | ICD-10-CM

## 2023-03-22 DIAGNOSIS — R102 Pelvic and perineal pain: Secondary | ICD-10-CM

## 2023-03-22 HISTORY — DX: Unspecified appendicitis: K37

## 2023-03-22 LAB — POCT URINALYSIS DIP (MANUAL ENTRY)
Bilirubin, UA: NEGATIVE
Blood, UA: NEGATIVE
Glucose, UA: NEGATIVE mg/dL
Leukocytes, UA: NEGATIVE
Nitrite, UA: NEGATIVE
Spec Grav, UA: 1.02 (ref 1.010–1.025)
Urobilinogen, UA: 1 U/dL
pH, UA: 7 (ref 5.0–8.0)

## 2023-03-22 LAB — POCT URINE PREGNANCY: Preg Test, Ur: NEGATIVE

## 2023-03-22 MED ORDER — ONDANSETRON 4 MG PO TBDP: 4.0000 mg | ORAL_TABLET | Freq: Three times a day (TID) | ORAL | 0 refills | Status: DC | PRN

## 2023-03-22 NOTE — Discharge Instructions (Addendum)
I have prescribed Zofran that you can take every 8 hours as needed for nausea and vomiting. Otherwise I recommend alternating between ibuprofen and Tylenol every 4-6 hours as needed for pain. You can also try applying a heating pad as needed for pain.   As discussed because you have worsening pain related to your menstrual cycles, I recommend following up with OBGYN where they can provider further evaluation of your pain. Return here as needed. If you develop severe abdominal pain, excessive vomiting or high fevers please seek immediate medical treatment in the ER.

## 2023-03-22 NOTE — ED Provider Notes (Signed)
[facility]    CSN: [id number] Arrival date & time: 03/22/23  0845      History   Chief Complaint Chief Complaint  Patient presents with   Nausea   Abdominal Pain    HPI Vicki Clayton is a 23 y.o. female.   Patient presented with nausea and RLQ pain since 1/31 which was also the onset of her last menstrual cycle. Patient states that pain is worse with menstrual cycle, but endorses mild persistent pain since ending her menstrual cycle.   Denies vomiting, diarrhea, constipation, vaginal bleeding, vaginal discharges, and fever.   Patient states that she has been previously told that she has appendicitis at a recent ER visit where she was presenting with similar symptoms.   Abdominal Pain   Past Medical History:  Diagnosis Date   Anemia    Appendicitis    Heart murmur of newborn    Pain in throat 09/02/2018   Pain in throat 09/02/2018   Syncope     Patient Active Problem List   Diagnosis Date Noted   Strep throat 05/15/2022   Sharp pain 09/09/2021    Past Surgical History:  Procedure Laterality Date   ELBOW CLOSED REDUCTION W/ PERCUANEOUS PINNING     ELBOW SURGERY     KNEE ARTHROSCOPY WITH ANTERIOR CRUCIATE LIGAMENT (ACL) REPAIR Left 05/30/2019   Procedure: KNEE ARTHROSCOPY WITH ANTERIOR CRUCIATE LIGAMENT (ACL) REPAIR WITH QUADRICEPS AUTOGRAFT;  Surgeon: Bjorn Pippin, MD;  Location: [facility];  Service: Orthopedics;  Laterality: Left;    OB History     Gravida  0   Para  0   Term  0   Preterm  0   AB  0   Living  0      SAB  0   IAB  0   Ectopic  0   Multiple  0   Live Births  0            Home Medications    Prior to Admission medications   Medication Sig Start Date End Date Taking? Authorizing Provider  ondansetron (ZOFRAN-ODT) 4 MG disintegrating tablet Take 1 tablet (4 mg total) by mouth every 8 (eight) hours as needed for nausea or vomiting. 03/22/23  Yes Susann Givens, Famous Eisenhardt A, NP  albuterol  (VENTOLIN HFA) 108 (90 Base) MCG/ACT inhaler Inhale 1 puff into the lungs every 6 (six) hours as needed for shortness of breath. "I need a refill". 10/20/17   [provider]  famotidine (PEPCID) 20 MG tablet Take 1 tablet (20 mg total) by mouth 2 (two) times daily for 5 days. 04/07/22 05/12/22  Rosezella Rumpf, PA-C  fluticasone (FLONASE) 50 MCG/ACT nasal spray Place 2 sprays into both nostrils daily.    [provider]  Guaifenesin 1200 MG TB12 Take 1 tablet (1,200 mg total) by mouth in the morning and at bedtime. Patient not taking: Reported on 05/12/2022 11/03/21   Carlisle Beers, FNP  levonorgestrel (PLAN B 1-STEP) 1.5 MG tablet Take 1.5 mg by mouth once. 08/09/22   [provider]  medroxyPROGESTERone (DEPO-PROVERA) 150 MG/ML injection Inject 150 mg into the muscle every 3 (three) months.    [provider]  polyethylene glycol (MIRALAX) 17 g packet Take 17 g by mouth daily. 05/13/22   Glynn Octave, MD    Family History Family History  Problem Relation Age of Onset   Diabetes Other    Hypertension Other    Cancer Other    Migraines Neg  Hx    Seizures Neg Hx    Depression Neg Hx    Anxiety disorder Neg Hx    Bipolar disorder Neg Hx    Schizophrenia Neg Hx    ADD / ADHD Neg Hx    Autism Neg Hx     Social History Social History   Tobacco Use   Smoking status: Never    Passive exposure: Never   Smokeless tobacco: Never  Vaping Use   Vaping status: Never Used  Substance Use Topics   Alcohol use: No   Drug use: No     Allergies   Other; Antihistamines, diphenhydramine-type; Citrus; and Pineapple   Review of Systems Review of Systems  Gastrointestinal:  Positive for abdominal pain.   Per HPI  Physical Exam Triage Vital Signs ED Triage Vitals [03/22/23 0959]  Encounter Vitals Group     BP 109/63     Systolic BP Percentile      Diastolic BP Percentile      Pulse Rate 61     Resp 18     Temp 98.1 F (36.7 C)     Temp  Source Oral     SpO2 97 %     Weight      Height      Head Circumference      Peak Flow      Pain Score      Pain Loc      Pain Education      Exclude from Growth Chart    No data found.  Updated Vital Signs BP 109/63 (BP Location: Left Arm)   Pulse 61   Temp 98.1 F (36.7 C) (Oral)   Resp 18   LMP 03/03/2023 (Approximate) Comment: Spotting  SpO2 97%   Visual Acuity Right Eye Distance:   Left Eye Distance:   Bilateral Distance:    Right Eye Near:   Left Eye Near:    Bilateral Near:     Physical Exam Vitals and nursing note reviewed.  Constitutional:      General: She is not in acute distress.    Appearance: She is well-developed. She is not ill-appearing.  Cardiovascular:     Rate and Rhythm: Normal rate and regular rhythm.  Pulmonary:     Effort: Pulmonary effort is normal.     Breath sounds: Normal breath sounds.  Abdominal:     General: Abdomen is flat. Bowel sounds are normal. There is no distension.     Palpations: Abdomen is soft.     Tenderness: There is abdominal tenderness in the right lower quadrant. There is no guarding or rebound. Negative signs include Murphy's sign, Rovsing's sign, McBurney's sign, psoas sign and obturator sign.  Neurological:     Mental Status: She is alert.      UC Treatments / Results  Labs (all labs ordered are listed, but only abnormal results are displayed) Labs Reviewed  POCT URINALYSIS DIP (MANUAL ENTRY) - Abnormal; Notable for the following components:      Result Value   Ketones, POC UA trace (5) (*)    Protein Ur, POC trace (*)    All other components within normal limits  POCT URINE PREGNANCY - Normal    EKG   Radiology No results found.  Procedures Procedures (including critical care time)  Medications Ordered in UC Medications - No data to display  Initial Impression / Assessment and Plan / UC Course  I have reviewed the triage vital signs and the nursing notes.  Pertinent labs & imaging results  that were available during my care of the patient were reviewed by me and considered in my medical decision making (see chart for details).     Patient presented with nausea and right lower pelvic pain since 1/31 which was also the onset of her last menses. Patient states that pain was worse during menstrual cycle, but the pain as persisted. Denies any other related symptoms.   Patient stated that she was previously diagnosed with appendicitis at a recent ER visit when she was presenting  with similar symptoms.   Upon assessment patient is mildly tender to RLQ without rebound tenderness or guarding. No other significant findings on exam. Urine pregnancy was negative, UA results were unremarkable.    Upon further investigation, patient was seen in April of 2024 where imagining was done that ruled out appendicitis and other abdominal etiologies. Patient was told at that time that her symptoms could be indicative of early appendicitis and was told what signs to look out for and return for re-evaluation. Patient did not receive a diagnosis of appendicitis at that time.   Prescribed Zofran as needed for nausea. Discussed following up with OBGYN due to symptoms being closely related to menstrual cycle. Discussed return and ER precautions.  Final Clinical Impressions(s) / UC Diagnoses   Final diagnoses:  Pelvic pain in female  Nausea     Discharge Instructions      I have prescribed Zofran that you can take every 8 hours as needed for nausea and vomiting. Otherwise I recommend alternating between ibuprofen and Tylenol every 4-6 hours as needed for pain. You can also try applying a heating pad as needed for pain.   As discussed because you have worsening pain related to your menstrual cycles, I recommend following up with OBGYN where they can provider further evaluation of your pain. Return here as needed. If you develop severe abdominal pain, excessive vomiting or high fevers please seek immediate  medical treatment in the ER.     ED Prescriptions     Medication Sig Dispense Auth. Provider   ondansetron (ZOFRAN-ODT) 4 MG disintegrating tablet Take 1 tablet (4 mg total) by mouth every 8 (eight) hours as needed for nausea or vomiting. 10 tablet Wynonia Lawman A, NP      PDMP not reviewed this encounter.   Wynonia Lawman A, NP 03/22/23 1112

## 2023-03-22 NOTE — ED Triage Notes (Addendum)
Since her last period at the end of January pt states she has had nausea and RLQ swelling, tenderness, and pain. Pt states she has been previously diagnosed with appendicitis.   Home Interventions: Aleve, with no relief

## 2023-05-04 ENCOUNTER — Ambulatory Visit

## 2023-05-11 ENCOUNTER — Ambulatory Visit

## 2023-05-11 VITALS — BP 133/82 | HR 82 | Ht 59.0 in | Wt 176.0 lb

## 2023-05-11 DIAGNOSIS — Z3200 Encounter for pregnancy test, result unknown: Secondary | ICD-10-CM

## 2023-05-11 DIAGNOSIS — Z3202 Encounter for pregnancy test, result negative: Secondary | ICD-10-CM

## 2023-05-11 LAB — POCT URINE PREGNANCY: Preg Test, Ur: NEGATIVE

## 2023-05-11 NOTE — Progress Notes (Signed)
..  Ms. Mikula presents today for UPT. She complains of some cramping similar to menstrual cycle. Pt states that she had a positive UPT at home mid-March and then a lot of bleeding and cramping at the end of March. Pt states that she went to the hospital but left without being seen.  LMP: unsure due to previously being on Depo   OBJECTIVE: Appears well, in no apparent distress.  OB History     Gravida  0   Para  0   Term  0   Preterm  0   AB  0   Living  0      SAB  0   IAB  0   Ectopic  0   Multiple  0   Live Births  0          Home UPT Result: Positive (Pt reports test was done mid-March) In-Office UPT result: Negative I have reviewed the patient's medical, obstetrical, social, and family histories, and medications.   ASSESSMENT: Negative pregnancy test  PLAN: Consulted with Dr. Shea Evans in office and advised pt that HCG labs would be drawn today and the office will follow up with results. Advised of abnormal symptoms to monitor for and report or be evaluated at [facility], pt agreed.

## 2023-05-12 LAB — BETA HCG QUANT (REF LAB): hCG Quant: <1 m[IU]/mL

## 2023-05-29 ENCOUNTER — Ambulatory Visit

## 2023-05-29 VITALS — BP 113/72 | HR 78

## 2023-05-29 DIAGNOSIS — Z3201 Encounter for pregnancy test, result positive: Secondary | ICD-10-CM

## 2023-05-29 DIAGNOSIS — Z3200 Encounter for pregnancy test, result unknown: Secondary | ICD-10-CM

## 2023-05-29 LAB — POCT URINE PREGNANCY: Preg Test, Ur: POSITIVE — AB

## 2023-05-29 MED ORDER — PROMETHAZINE HCL 25 MG PO TABS: 25.0000 mg | ORAL_TABLET | Freq: Four times a day (QID) | ORAL | 1 refills | Status: DC | PRN

## 2023-05-29 MED ORDER — PRENATAL 28-0.8 MG PO TABS: 1.0000 | ORAL_TABLET | Freq: Every day | ORAL | 12 refills | Status: DC

## 2023-05-29 NOTE — Progress Notes (Signed)
 Vicki Clayton here for a UPT. Pt had a positive upt at home. LMP is 04/23/23.  Pt experienced SAB shortly before and this was the first date of her bleeding with her SAB. Informed we will perform dating ultrasound at next visit to be sure.    UPT in office Positive.    Reviewed medications and informed to start a PNV, if not already. Pt to follow up in 3 weeks for New OB Intake visit.   Prenatal care to be completed at [facility].

## 2023-06-19 ENCOUNTER — Emergency Department: Payer: Self-pay

## 2023-06-19 ENCOUNTER — Encounter: Payer: Self-pay | Admitting: Intensive Care

## 2023-06-19 ENCOUNTER — Other Ambulatory Visit: Payer: Self-pay

## 2023-06-19 ENCOUNTER — Emergency Department
Admission: EM | Admit: 2023-06-19 | Discharge: 2023-06-19 | Disposition: A | Discharge status: Home / Self Care | Payer: Self-pay | Attending: Emergency Medicine | Admitting: Emergency Medicine

## 2023-06-19 DIAGNOSIS — R102 Pelvic and perineal pain: Secondary | ICD-10-CM | POA: Insufficient documentation

## 2023-06-19 DIAGNOSIS — Z3491 Encounter for supervision of normal pregnancy, unspecified, first trimester: Secondary | ICD-10-CM

## 2023-06-19 DIAGNOSIS — O26891 Other specified pregnancy related conditions, first trimester: Secondary | ICD-10-CM | POA: Insufficient documentation

## 2023-06-19 DIAGNOSIS — Z3A08 8 weeks gestation of pregnancy: Secondary | ICD-10-CM | POA: Insufficient documentation

## 2023-06-19 LAB — CBC WITH DIFFERENTIAL/PLATELET
Abs Immature Granulocytes: 0.03 10*3/uL (ref 0.00–0.07)
Basophils Absolute: 0 10*3/uL (ref 0.0–0.1)
Basophils Relative: 0 %
Eosinophils Absolute: 0 10*3/uL (ref 0.0–0.5)
Eosinophils Relative: 1 %
HCT: 33.2 % — ABNORMAL LOW (ref 36.0–46.0)
Hemoglobin: 11 g/dL — ABNORMAL LOW (ref 12.0–15.0)
Immature Granulocytes: 0 %
Lymphocytes Relative: 31 %
Lymphs Abs: 2.1 10*3/uL (ref 0.7–4.0)
MCH: 30.1 pg (ref 26.0–34.0)
MCHC: 33.1 g/dL (ref 30.0–36.0)
MCV: 90.7 fL (ref 80.0–100.0)
Monocytes Absolute: 0.5 10*3/uL (ref 0.1–1.0)
Monocytes Relative: 7 %
Neutro Abs: 4.1 10*3/uL (ref 1.7–7.7)
Neutrophils Relative %: 61 %
Platelets: 237 10*3/uL (ref 150–400)
RBC: 3.66 MIL/uL — ABNORMAL LOW (ref 3.87–5.11)
RDW: 11.9 % (ref 11.5–15.5)
WBC: 6.7 10*3/uL (ref 4.0–10.5)
nRBC: 0 % (ref 0.0–0.2)

## 2023-06-19 LAB — BASIC METABOLIC PANEL WITH GFR
Anion gap: 6 (ref 5–15)
BUN: 11 mg/dL (ref 6–20)
CO2: 23 mmol/L (ref 22–32)
Calcium: 8.8 mg/dL — ABNORMAL LOW (ref 8.9–10.3)
Chloride: 105 mmol/L (ref 98–111)
Creatinine, Ser: 0.64 mg/dL (ref 0.44–1.00)
GFR, Estimated: >60 mL/min (ref >60)
Glucose, Bld: 88 mg/dL (ref 70–99)
Potassium: 3.6 mmol/L (ref 3.5–5.1)
Sodium: 134 mmol/L — ABNORMAL LOW (ref 135–145)

## 2023-06-19 LAB — URINALYSIS, ROUTINE W REFLEX MICROSCOPIC
Bilirubin Urine: NEGATIVE
Glucose, UA: NEGATIVE mg/dL
Hgb urine dipstick: NEGATIVE
Ketones, ur: NEGATIVE mg/dL
Leukocytes,Ua: NEGATIVE
Nitrite: NEGATIVE
Protein, ur: NEGATIVE mg/dL
Specific Gravity, Urine: 1.016 (ref 1.005–1.030)
pH: 6 (ref 5.0–8.0)

## 2023-06-19 LAB — HCG, QUANTITATIVE, PREGNANCY: hCG, Beta Chain, Quant, S: 216108 m[IU]/mL — ABNORMAL HIGH (ref <5)

## 2023-06-19 LAB — ABO/RH: ABO/RH(D): O POS

## 2023-06-19 MED ORDER — ONDANSETRON 4 MG PO TBDP: 4.0000 mg | ORAL_TABLET | Freq: Three times a day (TID) | ORAL | 0 refills | Status: DC | PRN

## 2023-06-19 NOTE — Discharge Instructions (Addendum)
 Follow-up with your scheduled appointment with your OB/GYN.  A prescription for Zofran  was sent to the pharmacy to begin taking as needed for nausea.  Increase fluids to stay hydrated.  Return to the emergency department if any severe worsening of your symptoms such as vaginal bleeding.

## 2023-06-19 NOTE — ED Provider Notes (Signed)
 [facility] Provider Note    Event Date/Time   First MD Initiated Contact with Patient 06/19/23 0719     (approximate)   History   Pelvic Pain   HPI  Vicki Clayton is a 23 y.o. female   presents to the ED with report of being [redacted] weeks pregnant.  Patient reports pelvic pain along with waves of nausea that is started for the last several weeks.  Patient denies any vaginal bleeding or discharge.  No urinary symptoms.  Patient has not been seen by OB yet but does have an appointment on 07/03/2023.  Patient reports that her OB/GYN is in [place] but does not recall the name.      Physical Exam   Triage Vital Signs: ED Triage Vitals  Encounter Vitals Group     BP 06/19/23 0711 120/70     Systolic BP Percentile --      Diastolic BP Percentile --      Pulse Rate 06/19/23 0711 72     Resp 06/19/23 0711 16     Temp 06/19/23 0711 98.5 F (36.9 C)     Temp Source 06/19/23 0711 Oral     SpO2 06/19/23 0711 100 %     Weight 06/19/23 0710 176 lb (79.8 kg)     Height 06/19/23 0710 4' 11.5" (1.511 m)     Head Circumference --      Peak Flow --      Pain Score 06/19/23 0710 8     Pain Loc --      Pain Education --      Exclude from Growth Chart --     Most recent vital signs: Vitals:   06/19/23 0711  BP: 120/70  Pulse: 72  Resp: 16  Temp: 98.5 F (36.9 C)  SpO2: 100%     General: Awake, no distress.  CV:  Good peripheral perfusion.  Heart regular rate rhythm. Resp:  Normal effort.  Lungs clear bilaterally. Abd:  No distention.  Other:     ED Results / Procedures / Treatments   Labs (all labs ordered are listed, but only abnormal results are displayed) Labs Reviewed  CBC WITH DIFFERENTIAL/PLATELET - Abnormal; Notable for the following components:      Result Value   RBC 3.66 (*)    Hemoglobin 11.0 (*)    HCT 33.2 (*)    All other components within normal limits  BASIC METABOLIC PANEL WITH GFR - Abnormal; Notable for the following  components:   Sodium 134 (*)    Calcium 8.8 (*)    All other components within normal limits  URINALYSIS, ROUTINE W REFLEX MICROSCOPIC - Abnormal; Notable for the following components:   Color, Urine YELLOW (*)    APPearance HAZY (*)    All other components within normal limits  HCG, QUANTITATIVE, PREGNANCY - Abnormal; Notable for the following components:   hCG, Beta Chain, Quant, S 216,108 (*)    All other components within normal limits  ABO/RH     RADIOLOGY Ultrasound OB less than 14 weeks per radiology shows a single IUP with a heart rate of 167 corresponding to 8 weeks 3 days per crown rump length.  Also a small subchorionic hemorrhage occupying less than 25% of the circumference of the gestational sac.   PROCEDURES:  Critical Care performed:   Procedures   MEDICATIONS ORDERED IN ED: Medications - No data to display   IMPRESSION / MDM / ASSESSMENT AND PLAN / ED COURSE  I reviewed the triage vital signs and the nursing notes.   Differential diagnosis includes, but is not limited to, first trimester pregnancy,   23 year old female presents to the ED with complaint of vaginal pain without vaginal bleeding and a history of being approximately [redacted] weeks pregnant.  Patient states that she has an appointment with her OB/GYN in [place] scheduled but is unable to see anybody until that scheduled appointment.  No vaginal bleeding per patient.  Beta-hCG is 216,000, BMP and CBC unremarkable, ABO Rh is O+ and urinalysis was negative.  Discussed ultrasound findings with patient.  Encouraged her to keep her appointment with her OB/GYN in [place] and return to the emergency department if any severe worsening of her symptoms or urgent concerns.     Patient's presentation is most consistent with acute complicated illness / injury requiring diagnostic workup.  FINAL CLINICAL IMPRESSION(S) / ED DIAGNOSES   Final diagnoses:  First trimester pregnancy     Rx / DC Orders   ED  Discharge Orders          Ordered    ondansetron  (ZOFRAN -ODT) 4 MG disintegrating tablet  Every 8 hours PRN        06/19/23 1054             Note:  This document was prepared using Dragon voice recognition software and may include unintentional dictation errors.   Stafford Eagles, PA-C 06/19/23 1151    Bryson Carbine, MD 06/19/23 [phone]

## 2023-06-19 NOTE — ED Triage Notes (Signed)
 Patient reports she is [redacted] weeks pregnant. Patient c/o pelvic pain that started a few weeks ago.   Denies vaginal discharge. Denies urinary symptoms  Patient has OB appointment scheduled June 2nd.

## 2023-06-19 NOTE — ED Notes (Signed)
 See triage notes. Patient c/o abdominal cramping and slowing of eating. Patient stated it takes her two days to eat a plate of food.

## 2023-06-20 ENCOUNTER — Other Ambulatory Visit: Payer: Self-pay

## 2023-06-20 DIAGNOSIS — O99611 Diseases of the digestive system complicating pregnancy, first trimester: Secondary | ICD-10-CM | POA: Insufficient documentation

## 2023-06-20 DIAGNOSIS — K5900 Constipation, unspecified: Secondary | ICD-10-CM | POA: Insufficient documentation

## 2023-06-20 DIAGNOSIS — Z3A08 8 weeks gestation of pregnancy: Secondary | ICD-10-CM | POA: Insufficient documentation

## 2023-06-20 LAB — CBC
HCT: 33.5 % — ABNORMAL LOW (ref 36.0–46.0)
Hemoglobin: 11.2 g/dL — ABNORMAL LOW (ref 12.0–15.0)
MCH: 30.4 pg (ref 26.0–34.0)
MCHC: 33.4 g/dL (ref 30.0–36.0)
MCV: 90.8 fL (ref 80.0–100.0)
Platelets: 184 10*3/uL (ref 150–400)
RBC: 3.69 MIL/uL — ABNORMAL LOW (ref 3.87–5.11)
RDW: 11.9 % (ref 11.5–15.5)
WBC: 7.8 10*3/uL (ref 4.0–10.5)
nRBC: 0 % (ref 0.0–0.2)

## 2023-06-20 LAB — COMPREHENSIVE METABOLIC PANEL WITH GFR
ALT: 11 U/L (ref 0–44)
AST: 15 U/L (ref 15–41)
Albumin: 3.9 g/dL (ref 3.5–5.0)
Alkaline Phosphatase: 43 U/L (ref 38–126)
Anion gap: 10 (ref 5–15)
BUN: 9 mg/dL (ref 6–20)
CO2: 21 mmol/L — ABNORMAL LOW (ref 22–32)
Calcium: 9.2 mg/dL (ref 8.9–10.3)
Chloride: 106 mmol/L (ref 98–111)
Creatinine, Ser: 0.63 mg/dL (ref 0.44–1.00)
GFR, Estimated: >60 mL/min (ref >60)
Glucose, Bld: 102 mg/dL — ABNORMAL HIGH (ref 70–99)
Potassium: 4.4 mmol/L (ref 3.5–5.1)
Sodium: 137 mmol/L (ref 135–145)
Total Bilirubin: 0.6 mg/dL (ref 0.0–1.2)
Total Protein: 7 g/dL (ref 6.5–8.1)

## 2023-06-20 LAB — LIPASE, BLOOD: Lipase: 33 U/L (ref 11–51)

## 2023-06-20 LAB — HCG, QUANTITATIVE, PREGNANCY: hCG, Beta Chain, Quant, S: >250000 m[IU]/mL — ABNORMAL HIGH (ref <5)

## 2023-06-20 NOTE — Group Note (Deleted)
 Date:  06/20/2023 Time:  9:45 PM  Group Topic/Focus:  Wrap-Up Group:   The focus of this group is to help patients review their daily goal of treatment and discuss progress on daily workbooks.     Participation Level:  {BHH PARTICIPATION EAVWU:98119}  Participation Quality:  {BHH PARTICIPATION QUALITY:22265}  Affect:  {BHH AFFECT:22266}  Cognitive:  {BHH COGNITIVE:22267}  Insight: {BHH Insight2:20797}  Engagement in Group:  {BHH ENGAGEMENT IN JYNWG:95621}  Modes of Intervention:  {BHH MODES OF INTERVENTION:22269}  Additional Comments:  ***  Maglione,Preciosa Bundrick E 06/20/2023, 9:45 PM

## 2023-06-20 NOTE — ED Triage Notes (Signed)
 Pt to ED via POV c/o lower abd pain that has been going on for a couple weeks but got worse today. Was seen here recently for same. Denies N/V/D. Denies vaginal discharge or urinary symptoms

## 2023-06-21 ENCOUNTER — Emergency Department: Payer: Self-pay

## 2023-06-21 ENCOUNTER — Emergency Department
Admission: EM | Admit: 2023-06-21 | Discharge: 2023-06-21 | Disposition: A | Discharge status: Home / Self Care | Payer: Self-pay | Attending: Emergency Medicine | Admitting: Emergency Medicine

## 2023-06-21 DIAGNOSIS — R102 Pelvic and perineal pain: Secondary | ICD-10-CM

## 2023-06-21 DIAGNOSIS — K5900 Constipation, unspecified: Secondary | ICD-10-CM

## 2023-06-21 DIAGNOSIS — R1032 Left lower quadrant pain: Secondary | ICD-10-CM

## 2023-06-21 LAB — WET PREP, GENITAL
Clue Cells Wet Prep HPF POC: NONE SEEN
Sperm: NONE SEEN
Trich, Wet Prep: NONE SEEN
WBC, Wet Prep HPF POC: <10 (ref <10)
Yeast Wet Prep HPF POC: NONE SEEN

## 2023-06-21 LAB — URINALYSIS, ROUTINE W REFLEX MICROSCOPIC
Bilirubin Urine: NEGATIVE
Glucose, UA: NEGATIVE mg/dL
Hgb urine dipstick: NEGATIVE
Ketones, ur: NEGATIVE mg/dL
Leukocytes,Ua: NEGATIVE
Nitrite: NEGATIVE
Protein, ur: NEGATIVE mg/dL
Specific Gravity, Urine: 1.026 (ref 1.005–1.030)
pH: 6 (ref 5.0–8.0)

## 2023-06-21 LAB — CHLAMYDIA/NGC RT PCR (ARMC ONLY)
Chlamydia Tr: NOT DETECTED
N gonorrhoeae: NOT DETECTED

## 2023-06-21 LAB — POC URINE PREG, ED: Preg Test, Ur: POSITIVE — AB

## 2023-06-21 MED ORDER — OXYCODONE-ACETAMINOPHEN 5-325 MG PO TABS
1.0000 | ORAL_TABLET | Freq: Once | ORAL | Status: AC
  Administered 2023-06-21: 1 via ORAL
  Filled 2023-06-21: qty 1

## 2023-06-21 MED ORDER — LACTULOSE 10 GM/15ML PO SOLN: 20.0000 g | Freq: Every day | ORAL | 0 refills | Status: DC | PRN

## 2023-06-21 MED ORDER — ACETAMINOPHEN 325 MG PO TABS
650.0000 mg | ORAL_TABLET | Freq: Once | ORAL | Status: AC
  Administered 2023-06-21: 650 mg via ORAL
  Filled 2023-06-21: qty 2

## 2023-06-21 MED ORDER — ONDANSETRON 4 MG PO TBDP
4.0000 mg | ORAL_TABLET | Freq: Once | ORAL | Status: AC
  Administered 2023-06-21: 4 mg via ORAL
  Filled 2023-06-21: qty 1

## 2023-06-21 NOTE — ED Provider Notes (Signed)
 [facility] Provider Note    Event Date/Time   First MD Initiated Contact with Patient 06/21/23 0157     (approximate)   History   Abdominal Pain   HPI  Vicki Clayton is a 23 y.o. female G1, P0 approximately [redacted] weeks pregnant who returns to the ED from home with persistent and worsening left lower abdominal/pelvic pain.  Patient seen in the ED on 06/19/2023 for similar pain ongoing for 2 weeks.  Had negative workup and reassuring ultrasound.  Endorses nausea.  Denies fever/chills, chest pain, shortness of breath, vaginal discharge, vaginal bleeding, dysuria or diarrhea.  Endorses constipation.  States she has not had a good bowel movement for 2 weeks which is fairly typical for her.  In the past she has been on MiraLAX  but stopped taking it because she felt like it was not helping her constipation.     Past Medical History   Past Medical History:  Diagnosis Date   Anemia    Appendicitis    Heart murmur of newborn    Pain in throat 09/02/2018   Pain in throat 09/02/2018   Syncope      Active Problem List   Patient Active Problem List   Diagnosis Date Noted   Strep throat 05/15/2022   Sharp pain 09/09/2021     Past Surgical History   Past Surgical History:  Procedure Laterality Date   ELBOW CLOSED REDUCTION W/ PERCUANEOUS PINNING     ELBOW SURGERY     KNEE ARTHROSCOPY WITH ANTERIOR CRUCIATE LIGAMENT (ACL) REPAIR Left 05/30/2019   Procedure: KNEE ARTHROSCOPY WITH ANTERIOR CRUCIATE LIGAMENT (ACL) REPAIR WITH QUADRICEPS AUTOGRAFT;  Surgeon: Micheline Ahr, MD;  Location: [facility];  Service: Orthopedics;  Laterality: Left;     Home Medications   Prior to Admission medications   Medication Sig Start Date End Date Taking? Authorizing Provider  lactulose (CHRONULAC) 10 GM/15ML solution Take 30 mLs (20 g total) by mouth daily as needed for mild constipation. 06/21/23  Yes Norlene Beavers, MD  albuterol  (VENTOLIN  HFA) 108 (90 Base)  MCG/ACT inhaler Inhale 1 puff into the lungs every 6 (six) hours as needed for shortness of breath. "I need a refill". Patient not taking: Reported on 05/11/2023 10/20/17   [provider]  famotidine  (PEPCID ) 20 MG tablet Take 1 tablet (20 mg total) by mouth 2 (two) times daily for 5 days. 04/07/22 05/12/22  Everlyn Hockey, PA-C  fluticasone  (FLONASE ) 50 MCG/ACT nasal spray Place 2 sprays into both nostrils daily. Patient not taking: Reported on 05/29/2023    [provider]  Guaifenesin  1200 MG TB12 Take 1 tablet (1,200 mg total) by mouth in the morning and at bedtime. Patient not taking: Reported on 05/12/2022 11/03/21   Starlene Eaton, FNP  levonorgestrel (PLAN B 1-STEP) 1.5 MG tablet Take 1.5 mg by mouth once. Patient not taking: Reported on 05/29/2023 08/09/22   [provider]  medroxyPROGESTERone  (DEPO-PROVERA ) 150 MG/ML injection Inject 150 mg into the muscle every 3 (three) months. Patient not taking: Reported on 05/11/2023    [provider]  ondansetron  (ZOFRAN -ODT) 4 MG disintegrating tablet Take 1 tablet (4 mg total) by mouth every 8 (eight) hours as needed for nausea or vomiting. 06/19/23   Deeanna Farm L, PA-C  polyethylene glycol (MIRALAX ) 17 g packet Take 17 g by mouth daily. Patient not taking: Reported on 05/29/2023 05/13/22   Earma Gloss, MD  Prenatal 28-0.8 MG TABS Take 1 tablet by mouth  daily. 05/29/23   Tresia Fruit, MD  promethazine  (PHENERGAN ) 25 MG tablet Take 1 tablet (25 mg total) by mouth every 6 (six) hours as needed for nausea or vomiting. 05/29/23   Tresia Fruit, MD     Allergies  Other; Antihistamines, diphenhydramine-type; Citrus; and Pineapple   Family History   Family History  Problem Relation Age of Onset   Diabetes Other    Hypertension Other    Cancer Other    Migraines Neg Hx    Seizures Neg Hx    Depression Neg Hx    Anxiety disorder Neg Hx    Bipolar disorder Neg Hx    Schizophrenia Neg Hx    ADD  / ADHD Neg Hx    Autism Neg Hx      Physical Exam  Triage Vital Signs: ED Triage Vitals  Encounter Vitals Group     BP 06/20/23 2124 105/60     Systolic BP Percentile --      Diastolic BP Percentile --      Pulse Rate 06/20/23 2124 68     Resp 06/20/23 2124 20     Temp 06/20/23 2124 98 F (36.7 C)     Temp Source 06/20/23 2124 Oral     SpO2 06/20/23 2124 97 %     Weight 06/20/23 2125 176 lb (79.8 kg)     Height 06/20/23 2125 4\' 11"  (1.499 m)     Head Circumference --      Peak Flow --      Pain Score 06/20/23 2132 9     Pain Loc --      Pain Education --      Exclude from Growth Chart --     Updated Vital Signs: BP 104/65 (BP Location: Right Arm)   Pulse 61   Temp 98.1 F (36.7 C) (Oral)   Resp 16   Ht 4\' 11"  (1.499 m)   Wt 79.8 kg   LMP 04/17/2023 (Exact Date)   SpO2 100%   BMI 35.55 kg/m    General: Awake, no distress.  CV:  RRR.  Good peripheral perfusion. Resp:  Normal effort.  CTAB. Abd:  Minimal left lower quadrant/pelvis tenderness to palpation without rebound or guarding.  No distention.  Other:  No truncal vesicles.   ED Results / Procedures / Treatments  Labs (all labs ordered are listed, but only abnormal results are displayed) Labs Reviewed  COMPREHENSIVE METABOLIC PANEL WITH GFR - Abnormal; Notable for the following components:      Result Value   CO2 21 (*)    Glucose, Bld 102 (*)    All other components within normal limits  CBC - Abnormal; Notable for the following components:   RBC 3.69 (*)    Hemoglobin 11.2 (*)    HCT 33.5 (*)    All other components within normal limits  URINALYSIS, ROUTINE W REFLEX MICROSCOPIC - Abnormal; Notable for the following components:   Color, Urine YELLOW (*)    APPearance CLEAR (*)    All other components within normal limits  HCG, QUANTITATIVE, PREGNANCY - Abnormal; Notable for the following components:   hCG, Beta Chain, Quant, S >250,000 (*)    All other components within normal limits  POC URINE  PREG, ED - Abnormal; Notable for the following components:   Preg Test, Ur Positive (*)    All other components within normal limits  WET PREP, GENITAL  CHLAMYDIA/NGC RT PCR ([facility] ONLY)  LIPASE, BLOOD     EKG  None   RADIOLOGY I have independently visualized interpreted patient's imaging study as well as noted the radiology interpretation:  Ultrasound: IUP 8-week, 5 days; resolved subchorionic hemorrhage  Official radiology report(s): US  OB LESS THAN 14 WEEKS WITH OB TRANSVAGINAL Result Date: 06/21/2023 CLINICAL DATA:  Left lower quadrant abdominal pain, pregnant, assigned gestational age [redacted] weeks, 2 days EXAM: OBSTETRIC <14 WK US  AND TRANSVAGINAL OB US  TECHNIQUE: Both transabdominal and transvaginal ultrasound examinations were performed for complete evaluation of the gestation as well as the maternal uterus, adnexal regions, and pelvic cul-de-sac. Transvaginal technique was performed to assess early pregnancy. COMPARISON:  06/19/2023 FINDINGS: Intrauterine gestational sac: Single Yolk sac:  Visualized. Embryo:  Visualized. Cardiac Activity: Visualized. Heart Rate: 158 bpm CRL:  21 mm   8 w   5 d                  US  EDC: 01/26/2024 Subchorionic hemorrhage: None visualized. The previously identified small subchorionic hemorrhage has resolved. Maternal uterus/adnexae: The uterus is anteverted. Cervix is closed and is unremarkable. No intrauterine masses are seen. The maternal ovaries are unremarkable. Corpus luteum noted within right ovary. No free fluid seen within pelvis. IMPRESSION: Single living intrauterine gestation with an estimated gestational age of [redacted] weeks, 5 days. Resolved subchorionic hemorrhage. Electronically Signed   By: Worthy Heads M.D.   On: 06/21/2023 04:44     PROCEDURES:  Critical Care performed: No  Procedures   MEDICATIONS ORDERED IN ED: Medications  acetaminophen  (TYLENOL ) tablet 650 mg (650 mg Oral Given 06/21/23 0222)  ondansetron  (ZOFRAN -ODT)  disintegrating tablet 4 mg (4 mg Oral Given 06/21/23 0222)  oxyCODONE -acetaminophen  (PERCOCET/ROXICET) 5-325 MG per tablet 1 tablet (1 tablet Oral Given 06/21/23 0433)     IMPRESSION / MDM / ASSESSMENT AND PLAN / ED COURSE  I reviewed the triage vital signs and the nursing notes.                             23 year old G1, P0 approximately [redacted] weeks pregnant returns to the ED for left lower abdominal/pelvic pain. Differential diagnosis includes, but is not limited to, ovarian cyst, ovarian torsion, acute appendicitis, diverticulitis, urinary tract infection/pyelonephritis, endometriosis, bowel obstruction, colitis, renal colic, gastroenteritis, hernia, fibroids, endometriosis, pregnancy related pain including ectopic pregnancy, etc. I personally reviewed patient's records and note her ED visit from 06/19/2023.  Patient's presentation is most consistent with acute complicated illness / injury requiring diagnostic workup.  Laboratory results demonstrate uptrending beta-hCG, stable hemoglobin and electrolytes.  UA negative for infection.  Will obtain STI, repeat ultrasound.  If negative, consider lactulose for constipation.  Will reassess.  Clinical Course as of 06/21/23 1610  Wed Jun 21, 2023  0452 US  demonstrates IUP [redacted]w[redacted]d, resolved subchorionic hemorrhage.  Patient will check MyChart for results of chlamydia/gonorrhea but she shows no concern for STD.  Will prescribe lactulose for constipation, she is to take Zofran  (as previously prescribed) for nausea and patient will follow-up with OB/GYN as scheduled.  Strict return precautions given.  Patient verbalizes understanding and agrees with plan of care. [JS]    Clinical Course User Index [JS] Norlene Beavers, MD     FINAL CLINICAL IMPRESSION(S) / ED DIAGNOSES   Final diagnoses:  Pelvic pain in female  Left lower quadrant abdominal pain  Constipation, unspecified constipation type     Rx / DC Orders   ED Discharge Orders  Ordered     lactulose (CHRONULAC) 10 GM/15ML solution  Daily PRN        06/21/23 0454             Note:  This document was prepared using Dragon voice recognition software and may include unintentional dictation errors.   Darianny Momon J, MD 06/21/23 [phone]

## 2023-06-21 NOTE — Discharge Instructions (Addendum)
 Take Lactulose as needed for bowel movements. You may take Zofran  as previously prescribed as needed for nausea.  Return to the ER for worsening symptoms, persistent vomiting, difficulty breathing or other concerns.

## 2023-06-23 ENCOUNTER — Other Ambulatory Visit: Payer: Self-pay

## 2023-06-23 ENCOUNTER — Emergency Department: Payer: Self-pay

## 2023-06-23 ENCOUNTER — Emergency Department
Admission: EM | Admit: 2023-06-23 | Discharge: 2023-06-23 | Disposition: A | Discharge status: Home / Self Care | Payer: Self-pay | Attending: Emergency Medicine | Admitting: Emergency Medicine

## 2023-06-23 DIAGNOSIS — O4690 Antepartum hemorrhage, unspecified, unspecified trimester: Secondary | ICD-10-CM

## 2023-06-23 DIAGNOSIS — O209 Hemorrhage in early pregnancy, unspecified: Secondary | ICD-10-CM | POA: Insufficient documentation

## 2023-06-23 LAB — CBC
HCT: 34.1 % — ABNORMAL LOW (ref 36.0–46.0)
Hemoglobin: 11.6 g/dL — ABNORMAL LOW (ref 12.0–15.0)
MCH: 30.8 pg (ref 26.0–34.0)
MCHC: 34 g/dL (ref 30.0–36.0)
MCV: 90.5 fL (ref 80.0–100.0)
Platelets: 239 10*3/uL (ref 150–400)
RBC: 3.77 MIL/uL — ABNORMAL LOW (ref 3.87–5.11)
RDW: 11.8 % (ref 11.5–15.5)
WBC: 8.5 10*3/uL (ref 4.0–10.5)
nRBC: 0 % (ref 0.0–0.2)

## 2023-06-23 LAB — HCG, QUANTITATIVE, PREGNANCY: hCG, Beta Chain, Quant, S: >250000 m[IU]/mL — ABNORMAL HIGH (ref <5)

## 2023-06-23 NOTE — ED Notes (Signed)
 Patient transported to Ultrasound

## 2023-06-23 NOTE — ED Triage Notes (Addendum)
 Pt comes via EMs from home with vaginal bleeding. Pt was just seen here two days ago for placenta bleeding. Pt is about [redacted] weeks pregnant. Pt states the bleeding has gotten worse. Pt states some cramping and going through about two pads.  BP 118/78 HR 65  99% RA CBG 86

## 2023-06-23 NOTE — ED Provider Notes (Signed)
 [facility] [place] Provider Note    Event Date/Time   First MD Initiated Contact with Patient 06/23/23 0945     (approximate)   History   Vaginal Bleeding   HPI  Vicki Clayton is a 23 y.o. female G1, P0 currently approximately 8 weeks and 5 days pregnant who presents today for evaluation of abdominal pain and vaginal bleeding.  Patient reports that this started approximately 1 week ago and she has been seen in the emergency department twice.  She feels that the bleeding has gotten heavier.  She is also feeling frustrated because she feels that no one has explained anything to her.  She also has nausea and vomiting in the mornings.  Patient Active Problem List   Diagnosis Date Noted   Strep throat 05/15/2022   Sharp pain 09/09/2021          Physical Exam   Triage Vital Signs: ED Triage Vitals  Encounter Vitals Group     BP 06/23/23 0946 (!) 107/57     Systolic BP Percentile --      Diastolic BP Percentile --      Pulse Rate 06/23/23 0946 65     Resp 06/23/23 0946 18     Temp 06/23/23 0946 98 F (36.7 C)     Temp src --      SpO2 06/23/23 0946 100 %     Weight 06/23/23 0941 176 lb (79.8 kg)     Height 06/23/23 0941 4\' 11"  (1.499 m)     Head Circumference --      Peak Flow --      Pain Score 06/23/23 0945 10     Pain Loc --      Pain Education --      Exclude from Growth Chart --     Most recent vital signs: Vitals:   06/23/23 0946  BP: (!) 107/57  Pulse: 65  Resp: 18  Temp: 98 F (36.7 C)  SpO2: 100%    Physical Exam Vitals and nursing note reviewed.  Constitutional:      General: Awake and alert. No acute distress.    Appearance: Normal appearance. The patient is normal weight.  HENT:     Head: Normocephalic and atraumatic.     Mouth: Mucous membranes are moist.  Eyes:     General: PERRL. Normal EOMs        Right eye: No discharge.        Left eye: No discharge.     Conjunctiva/sclera: Conjunctivae normal.   Cardiovascular:     Rate and Rhythm: Normal rate and regular rhythm.     Pulses: Normal pulses.  Pulmonary:     Effort: Pulmonary effort is normal. No respiratory distress.     Breath sounds: Normal breath sounds.  Abdominal:     Abdomen is soft. There is no abdominal tenderness. No rebound or guarding. No distention. Musculoskeletal:        General: No swelling. Normal range of motion.     Cervical back: Normal range of motion and neck supple.  Skin:    General: Skin is warm and dry.     Capillary Refill: Capillary refill takes less than 2 seconds.     Findings: No rash.  Neurological:     Mental Status: The patient is awake and alert.      ED Results / Procedures / Treatments   Labs (all labs ordered are listed, but only abnormal results are displayed) Labs Reviewed  CBC - Abnormal; Notable for the following components:      Result Value   RBC 3.77 (*)    Hemoglobin 11.6 (*)    HCT 34.1 (*)    All other components within normal limits  HCG, QUANTITATIVE, PREGNANCY - Abnormal; Notable for the following components:   hCG, Beta Chain, Quant, S >250,000 (*)    All other components within normal limits  POC URINE PREG, ED     EKG     RADIOLOGY I independently reviewed and interpreted imaging and agree with radiologists findings.     PROCEDURES:  Critical Care performed:   Procedures   MEDICATIONS ORDERED IN ED: Medications - No data to display   IMPRESSION / MDM / ASSESSMENT AND PLAN / ED COURSE  I reviewed the triage vital signs and the nursing notes.   Differential diagnosis includes, but is not limited to, subchorionic hemorrhage, inevitable abortion, spontaneous abortion, cervical ectropion.  I reviewed the patient's chart.  Patient was seen in the emergency department on 06/19/2023 and 06/21/2023 with the same complaint.  She had a small subchorionic hemorrhage on her ultrasound on 06/19/2023 which had resolved by her subsequent ultrasound on  06/21/2023.  Beta-hCG trended up between her 2 visits, she is Rh+.  Patient is awake and alert, hemodynamically stable and afebrile.  H&H obtained in triage is actually improved from yesterday.  Her hCG remains elevated.  Ultrasound obtained again given that she reports that she is still bleeding despite the absence of a subchorionic hemorrhage on her previous ultrasound.  This ultrasound reveals a 9+0 fetus that is normal in appearance with a heart rate of 173.  We discussed the importance of outpatient follow-up with her OB/GYN.  She reports that she is already established care.  We discussed return precautions.  Patient understands and agrees with plan.  Discharged in stable condition.  Patient's presentation is most consistent with acute complicated illness / injury requiring diagnostic workup.  Clinical Course as of 06/23/23 1334  Fri Jun 23, 2023  1310 Patient denies having had any further bleeding in the ER today [JP]    Clinical Course User Index [JP] Namari Breton E, PA-C     FINAL CLINICAL IMPRESSION(S) / ED DIAGNOSES   Final diagnoses:  Vaginal bleeding in pregnancy     Rx / DC Orders   ED Discharge Orders     None        Note:  This document was prepared using Dragon voice recognition software and may include unintentional dictation errors.   Terrika Zuver E, PA-C 06/23/23 1334    Bradler, Evan K, MD 06/24/23 [phone]

## 2023-06-23 NOTE — Discharge Instructions (Signed)
 Your ultrasound shows a normal pregnancy at 9 weeks.  Please follow-up with your OB/GYN.  Please return for any new, worsening, or change in symptoms or other concerns.  It was a pleasure caring for you today.

## 2023-07-03 ENCOUNTER — Encounter: Admitting: Obstetrics and Gynecology

## 2023-07-03 ENCOUNTER — Ambulatory Visit: Payer: Self-pay

## 2023-07-03 VITALS — BP 107/67 | HR 70

## 2023-07-03 DIAGNOSIS — Z3481 Encounter for supervision of other normal pregnancy, first trimester: Secondary | ICD-10-CM

## 2023-07-03 DIAGNOSIS — Z3A01 Less than 8 weeks gestation of pregnancy: Secondary | ICD-10-CM

## 2023-07-03 DIAGNOSIS — Z3480 Encounter for supervision of other normal pregnancy, unspecified trimester: Secondary | ICD-10-CM | POA: Insufficient documentation

## 2023-07-03 MED ORDER — ONDANSETRON 4 MG PO TBDP
4.0000 mg | ORAL_TABLET | Freq: Three times a day (TID) | ORAL | 0 refills | Status: DC | PRN
Start: 2023-07-03 — End: 2023-12-24

## 2023-07-03 MED ORDER — DOCUSATE SODIUM 100 MG PO CAPS: 100.0000 mg | ORAL_CAPSULE | Freq: Two times a day (BID) | ORAL | 0 refills | Status: DC | PRN

## 2023-07-03 MED ORDER — FAMOTIDINE 20 MG PO TABS: 20.0000 mg | ORAL_TABLET | Freq: Every day | ORAL | 0 refills | Status: DC

## 2023-07-03 NOTE — Progress Notes (Signed)
 New OB Intake  I connected with Thersa Flavors  on 07/03/23 at 11:00 AM EDT by in person and verified that I am speaking with the correct person using two identifiers. Nurse is located at [facility] and pt is located at [place].  I discussed the limitations, risks, security and privacy concerns of performing an evaluation and management service by telephone and the availability of in person appointments. I also discussed with the patient that there may be a patient responsible charge related to this service. The patient expressed understanding and agreed to proceed.  I explained I am completing New OB Intake today. We discussed EDD of Not found.. Pt is [id number]. I reviewed her allergies, medications and Medical/Surgical/OB history.    Patient Active Problem List   Diagnosis Date Noted   Strep throat 05/15/2022   Sharp pain 09/09/2021     Concerns addressed today  Delivery Plans Plans to deliver at [facility]. Discussed the nature of our practice with multiple providers including residents and students. Due to the size of the practice, the delivering provider may not be the same as those providing prenatal care.   Patient is not interested in water birth.  MyChart/Babyscripts MyChart access verified. I explained pt will have some visits in office and some virtually. Babyscripts instructions given and order placed. Patient verifies receipt of registration text/e-mail. Account successfully created and app downloaded. If patient is a candidate for Optimized scheduling, add to sticky note.   Blood Pressure Cuff/Weight Scale Pt has BP cuff at home and will begin checking BP. Reports frequently low readings. Explained after first prenatal appt pt will check weekly and document in Babyscripts. Patient does have weight scale.  Anatomy US  Explained first scheduled US  will be around 19 weeks. Anatomy US  scheduled for TBD at [facility].  Is patient a CenteringPregnancy candidate?  Not a Candidate   Is  patient a Mom+Baby Combined Care candidate?  Not a candidate   If accepted, confirm patient does not intend to move from the area for at least 12 months, then notify Mom+Baby staff  Is patient a candidate for Babyscripts Optimization? Yes, patient accepted    First visit review I reviewed new OB appt with patient. Explained pt will be seen by Dr. Donetta Furl at first visit. Discussed [facility] [place] genetic screening with patient. Collected Panorama and Horizon. Routine prenatal labs is  Collected. GC/CT done in hospital as well as ultrasound. UA done in hospital.  Last Pap N/A, pt is 23. Pt would like to defer to PP visit.   Ina Manas, RN 07/03/2023  11:04 AM

## 2023-07-04 LAB — HEMOGLOBIN A1C
Est. average glucose Bld gHb Est-mCnc: 105 mg/dL
Hgb A1c MFr Bld: 5.3 % (ref 4.8–5.6)

## 2023-07-04 LAB — CBC/D/PLT+RPR+RH+ABO+RUBIGG...
Antibody Screen: NEGATIVE
Basophils Absolute: 0 10*3/uL (ref 0.0–0.2)
Basos: 0 %
EOS (ABSOLUTE): 0 10*3/uL (ref 0.0–0.4)
Eos: 1 %
HCV Ab: NONREACTIVE
HIV Screen 4th Generation wRfx: NONREACTIVE
Hematocrit: 35.5 % (ref 34.0–46.6)
Hemoglobin: 11.6 g/dL (ref 11.1–15.9)
Hepatitis B Surface Ag: NEGATIVE
Immature Grans (Abs): 0 10*3/uL (ref 0.0–0.1)
Immature Granulocytes: 0 %
Lymphocytes Absolute: 1.6 10*3/uL (ref 0.7–3.1)
Lymphs: 24 %
MCH: 30.7 pg (ref 26.6–33.0)
MCHC: 32.7 g/dL (ref 31.5–35.7)
MCV: 94 fL (ref 79–97)
Monocytes Absolute: 0.5 10*3/uL (ref 0.1–0.9)
Monocytes: 8 %
Neutrophils Absolute: 4.3 10*3/uL (ref 1.4–7.0)
Neutrophils: 67 %
Platelets: 226 10*3/uL (ref 150–450)
RBC: 3.78 x10E6/uL (ref 3.77–5.28)
RDW: 12 % (ref 11.7–15.4)
RPR Ser Ql: NONREACTIVE
Rh Factor: POSITIVE
Rubella Antibodies, IGG: 1.65 {index} (ref >0.99)
WBC: 6.5 10*3/uL (ref 3.4–10.8)

## 2023-07-04 LAB — HCV INTERPRETATION

## 2023-07-05 LAB — URINE CULTURE

## 2023-07-08 LAB — PANORAMA PRENATAL TEST FULL PANEL:PANORAMA TEST PLUS 5 ADDITIONAL MICRODELETIONS: FETAL FRACTION: 10.1

## 2023-07-11 LAB — HORIZON CUSTOM: REPORT SUMMARY: NEGATIVE

## 2023-07-21 ENCOUNTER — Other Ambulatory Visit: Payer: Self-pay | Admitting: *Deleted

## 2023-07-31 ENCOUNTER — Encounter: Payer: Self-pay | Admitting: Obstetrics and Gynecology

## 2023-07-31 DIAGNOSIS — Z3480 Encounter for supervision of other normal pregnancy, unspecified trimester: Secondary | ICD-10-CM

## 2023-08-02 ENCOUNTER — Telehealth: Payer: Self-pay

## 2023-08-02 ENCOUNTER — Encounter: Payer: Self-pay | Admitting: Obstetrics & Gynecology

## 2023-08-02 NOTE — Telephone Encounter (Signed)
 Attempted to contact about babyscripts alert for elevated BP. No answer, left vm

## 2023-08-28 ENCOUNTER — Encounter: Payer: Self-pay | Admitting: Obstetrics and Gynecology

## 2023-08-28 ENCOUNTER — Inpatient Hospital Stay (HOSPITAL_COMMUNITY)
Admission: AD | Admit: 2023-08-28 | Discharge: 2023-08-28 | Disposition: A | Discharge status: Home / Self Care | Attending: Obstetrics and Gynecology | Admitting: Obstetrics and Gynecology

## 2023-08-28 ENCOUNTER — Ambulatory Visit: Payer: Self-pay | Admitting: Obstetrics and Gynecology

## 2023-08-28 ENCOUNTER — Other Ambulatory Visit: Payer: Self-pay

## 2023-08-28 VITALS — BP 103/70 | HR 111 | Wt 160.0 lb

## 2023-08-28 DIAGNOSIS — Z3A18 18 weeks gestation of pregnancy: Secondary | ICD-10-CM

## 2023-08-28 DIAGNOSIS — O210 Mild hyperemesis gravidarum: Secondary | ICD-10-CM | POA: Insufficient documentation

## 2023-08-28 DIAGNOSIS — O99282 Endocrine, nutritional and metabolic diseases complicating pregnancy, second trimester: Secondary | ICD-10-CM | POA: Diagnosis not present

## 2023-08-28 DIAGNOSIS — E860 Dehydration: Secondary | ICD-10-CM | POA: Insufficient documentation

## 2023-08-28 DIAGNOSIS — I498 Other specified cardiac arrhythmias: Secondary | ICD-10-CM | POA: Diagnosis not present

## 2023-08-28 DIAGNOSIS — Z8679 Personal history of other diseases of the circulatory system: Secondary | ICD-10-CM | POA: Insufficient documentation

## 2023-08-28 DIAGNOSIS — O219 Vomiting of pregnancy, unspecified: Secondary | ICD-10-CM | POA: Diagnosis present

## 2023-08-28 DIAGNOSIS — O99412 Diseases of the circulatory system complicating pregnancy, second trimester: Secondary | ICD-10-CM | POA: Insufficient documentation

## 2023-08-28 DIAGNOSIS — O211 Hyperemesis gravidarum with metabolic disturbance: Secondary | ICD-10-CM

## 2023-08-28 DIAGNOSIS — Z87898 Personal history of other specified conditions: Secondary | ICD-10-CM

## 2023-08-28 DIAGNOSIS — Z3480 Encounter for supervision of other normal pregnancy, unspecified trimester: Secondary | ICD-10-CM

## 2023-08-28 DIAGNOSIS — Z1331 Encounter for screening for depression: Secondary | ICD-10-CM

## 2023-08-28 DIAGNOSIS — Z6832 Body mass index (BMI) 32.0-32.9, adult: Secondary | ICD-10-CM

## 2023-08-28 HISTORY — DX: Mild hyperemesis gravidarum: O21.0

## 2023-08-28 HISTORY — DX: Personal history of other diseases of the circulatory system: Z86.79

## 2023-08-28 LAB — URINALYSIS, ROUTINE W REFLEX MICROSCOPIC
Bacteria, UA: NONE SEEN
Bilirubin Urine: NEGATIVE
Glucose, UA: 150 mg/dL — AB
Hgb urine dipstick: NEGATIVE
Ketones, ur: NEGATIVE mg/dL
Leukocytes,Ua: NEGATIVE
Nitrite: NEGATIVE
Protein, ur: 30 mg/dL — AB
Specific Gravity, Urine: 1.027 (ref 1.005–1.030)
pH: 7 (ref 5.0–8.0)

## 2023-08-28 LAB — WET PREP, GENITAL
Sperm: NONE SEEN
Trich, Wet Prep: NONE SEEN
WBC, Wet Prep HPF POC: >=10 — AB (ref <10)
Yeast Wet Prep HPF POC: NONE SEEN

## 2023-08-28 LAB — CBC
HCT: 32.7 % — ABNORMAL LOW (ref 36.0–46.0)
Hemoglobin: 11.1 g/dL — ABNORMAL LOW (ref 12.0–15.0)
MCH: 30.6 pg (ref 26.0–34.0)
MCHC: 33.9 g/dL (ref 30.0–36.0)
MCV: 90.1 fL (ref 80.0–100.0)
Platelets: 263 K/uL (ref 150–400)
RBC: 3.63 MIL/uL — ABNORMAL LOW (ref 3.87–5.11)
RDW: 12.3 % (ref 11.5–15.5)
WBC: 11.6 K/uL — ABNORMAL HIGH (ref 4.0–10.5)
nRBC: 0 % (ref 0.0–0.2)

## 2023-08-28 LAB — COMPREHENSIVE METABOLIC PANEL WITH GFR
ALT: 41 U/L (ref 0–44)
AST: 31 U/L (ref 15–41)
Albumin: 3.3 g/dL — ABNORMAL LOW (ref 3.5–5.0)
Alkaline Phosphatase: 78 U/L (ref 38–126)
Anion gap: 10 (ref 5–15)
BUN: 5 mg/dL — ABNORMAL LOW (ref 6–20)
CO2: 20 mmol/L — ABNORMAL LOW (ref 22–32)
Calcium: 9 mg/dL (ref 8.9–10.3)
Chloride: 106 mmol/L (ref 98–111)
Creatinine, Ser: 0.53 mg/dL (ref 0.44–1.00)
GFR, Estimated: >60 mL/min (ref >60)
Glucose, Bld: 81 mg/dL (ref 70–99)
Potassium: 3.7 mmol/L (ref 3.5–5.1)
Sodium: 136 mmol/L (ref 135–145)
Total Bilirubin: 0.5 mg/dL (ref 0.0–1.2)
Total Protein: 6.8 g/dL (ref 6.5–8.1)

## 2023-08-28 MED ORDER — METOCLOPRAMIDE HCL 10 MG PO TABS
10.0000 mg | ORAL_TABLET | Freq: Three times a day (TID) | ORAL | 1 refills | Status: DC
Start: 2023-08-28 — End: 2023-12-24

## 2023-08-28 MED ORDER — SCOPOLAMINE 1 MG/3DAYS TD PT72: 1.0000 | MEDICATED_PATCH | TRANSDERMAL | 12 refills | Status: DC

## 2023-08-28 MED ORDER — SCOPOLAMINE 1 MG/3DAYS TD PT72
1.0000 | MEDICATED_PATCH | TRANSDERMAL | Status: DC
  Administered 2023-08-28: 1.5 mg via TRANSDERMAL
  Filled 2023-08-28: qty 1

## 2023-08-28 MED ORDER — FAMOTIDINE 20 MG PO TABS: 20.0000 mg | ORAL_TABLET | Freq: Two times a day (BID) | ORAL | 0 refills | Status: DC

## 2023-08-28 MED ORDER — GLYCOPYRROLATE 0.2 MG/ML IJ SOLN
0.1000 mg | Freq: Once | INTRAMUSCULAR | Status: AC
  Administered 2023-08-28: 0.1 mg via INTRAVENOUS
  Filled 2023-08-28: qty 1

## 2023-08-28 MED ORDER — GLYCOPYRROLATE 1 MG PO TABS: 1.0000 mg | ORAL_TABLET | Freq: Three times a day (TID) | ORAL | 1 refills | Status: DC

## 2023-08-28 MED ORDER — LACTATED RINGERS IV BOLUS
1000.0000 mL | Freq: Once | INTRAVENOUS | Status: AC
  Administered 2023-08-28: 1000 mL via INTRAVENOUS

## 2023-08-28 MED ORDER — METOCLOPRAMIDE HCL 5 MG/ML IJ SOLN
10.0000 mg | Freq: Once | INTRAMUSCULAR | Status: AC
  Administered 2023-08-28: 10 mg via INTRAVENOUS
  Filled 2023-08-28: qty 2

## 2023-08-28 NOTE — Progress Notes (Signed)
 Pt has concerns with weight - has N&V no relief with Zofran  or Phenergan .  Pt states she can't eat a lot.   Pt also has HA- no relief with Tylenol , heart rate increase, dizziness, lower back pain, and increase in saliva.  Pt recently dx with oral thrush - using Rx mouthwash currently.  Pt defers pap to her PP visit.

## 2023-08-28 NOTE — MAU Note (Addendum)
 Vicki Clayton is a 23 y.o. at [redacted]w[redacted]d here in MAU reporting: sent from office.  Can't keep anything down, no solids or liquids.  Sometimes is throwing up acid.  Be dizzy sometimes, be really tired. (Taking zofran - last took this morning, also taking phenergan . Heart has been racing.  been having this HA that won't go away. Has taken tylenol , not helping.  Pt is a spitter.  Onset of complaint: ongoing Pain score: 8 Vitals:   08/28/23 1208  BP: 113/66  Pulse: 78  Resp: 16  Temp: 98.2 F (36.8 C)  SpO2: 100%     FHT:145 Lab orders placed from triage:   Urine EKG done in triage

## 2023-08-28 NOTE — Discharge Instructions (Addendum)
 Use the metoclopramide  three times a day, the scopolamine  patch every 3 days, and Zofran  for the breakthrough vomiting as needed. I also increased your famotidine  two twice a day.

## 2023-08-28 NOTE — MAU Provider Note (Addendum)
 Chief Complaint: Emesis   Event Date/Time   First Provider Initiated Contact with Patient 08/28/23 1230      SUBJECTIVE HPI: Vicki Clayton is a 23 y.o. G2P0010 at [redacted]w[redacted]d by LMP, early ultrasound who presents to maternity admissions reporting nausea vomiting.  This has been an ongoing issue for entire pregnancy, current home regimen is Zofran  every 4 hours as needed and Phenergan  in the evening, on this regimen she is still vomiting about 8 times a day.  Taste of spit in mouth is a trigger, as well as meals.  Also has constant right frontal headache, nonradiating, no aura or other history of migraines.  She also endorses generalized abdominal pain and discomfort similar to period cramps that are worse when vomiting, and dizziness.  She denies vaginal bleeding, vaginal itching/burning, urinary symptoms,  n/v, or fever/chills.    HPI  Past Medical History:  Diagnosis Date   Anemia    Appendicitis    Heart murmur of newborn    Psychogenic nonepileptic seizure    Vasovagal syncope    Past Surgical History:  Procedure Laterality Date   ELBOW CLOSED REDUCTION W/ PERCUANEOUS PINNING     ELBOW SURGERY     KNEE ARTHROSCOPY WITH ANTERIOR CRUCIATE LIGAMENT (ACL) REPAIR Left 05/30/2019   Procedure: KNEE ARTHROSCOPY WITH ANTERIOR CRUCIATE LIGAMENT (ACL) REPAIR WITH QUADRICEPS AUTOGRAFT;  Surgeon: Cristy Bonner DASEN, MD;  Location: [facility];  Service: Orthopedics;  Laterality: Left;   Social History   Socioeconomic History   Marital status: Single    Spouse name: Not on file   Number of children: Not on file   Years of education: Not on file   Highest education level: Not on file  Occupational History   Not on file  Tobacco Use   Smoking status: Never    Passive exposure: Never   Smokeless tobacco: Never  Vaping Use   Vaping status: Never Used  Substance and Sexual Activity   Alcohol use: No   Drug use: No   Sexual activity: Not Currently    Birth control/protection:  None  Other Topics Concern   Not on file  Social History Narrative   Appollonia is in the 12th grade at [employer]; she struggles in school. She lives with parents. She enjoys dancing and step.    Social Drivers of Corporate investment banker Strain: Not on file  Food Insecurity: No Food Insecurity (08/28/2023)   Hunger Vital Sign    Worried About Running Out of Food in the Last Year: Never true    Ran Out of Food in the Last Year: Never true  Transportation Needs: No Transportation Needs (08/28/2023)   PRAPARE - Administrator, Civil Service (Medical): No    Lack of Transportation (Non-Medical): No  Physical Activity: Not on file  Stress: Not on file  Social Connections: Not on file  Intimate Partner Violence: Not At Risk (08/28/2023)   Humiliation, Afraid, Rape, and Kick questionnaire    Fear of Current or Ex-Partner: No    Emotionally Abused: No    Physically Abused: No    Sexually Abused: No   No current facility-administered medications on file prior to encounter.   Current Outpatient Medications on File Prior to Encounter  Medication Sig Dispense Refill   ondansetron  (ZOFRAN -ODT) 4 MG disintegrating tablet Take 1 tablet (4 mg total) by mouth every 8 (eight) hours as needed for nausea or vomiting. 12 tablet 0   Prenatal 28-0.8 MG TABS Take  1 tablet by mouth daily. 30 tablet 12   promethazine  (PHENERGAN ) 25 MG tablet Take 1 tablet (25 mg total) by mouth every 6 (six) hours as needed for nausea or vomiting. 30 tablet 1   albuterol  (VENTOLIN  HFA) 108 (90 Base) MCG/ACT inhaler Inhale 1 puff into the lungs every 6 (six) hours as needed for shortness of breath. I need a refill. (Patient not taking: Reported on 05/11/2023)     docusate sodium  (COLACE) 100 MG capsule Take 1 capsule (100 mg total) by mouth 2 (two) times daily as needed for mild constipation. (Patient not taking: Reported on 08/28/2023) 10 capsule 0   fluticasone  (FLONASE ) 50 MCG/ACT nasal spray Place 2 sprays  into both nostrils daily. (Patient not taking: Reported on 05/11/2023)     polyethylene glycol (MIRALAX ) 17 g packet Take 17 g by mouth daily. (Patient not taking: Reported on 05/11/2023) 14 each 0   Allergies  Allergen Reactions   Antihistamines, Diphenhydramine-Type Anaphylaxis   Blueberry Flavor [Flavoring Agent] Anaphylaxis   Citrus Hives and Other (See Comments)    Mouth Burns    ROS:  Pertinent positives/negatives listed above.  I have reviewed patient's Past Medical Hx, Surgical Hx, Family Hx, Social Hx, medications and allergies.   Physical Exam  Patient Vitals for the past 24 hrs:  BP Temp Temp src Pulse Resp SpO2 Height Weight  08/28/23 1500 (!) 112/56 -- -- 80 16 -- -- --  08/28/23 1430 -- -- -- 75 -- 100 % -- --  08/28/23 1255 -- -- -- -- -- 99 % -- --  08/28/23 1253 110/63 -- -- 75 16 -- -- --  08/28/23 1208 113/66 98.2 F (36.8 C) Oral 78 16 100 % 4' 11.5 (1.511 m) 160 lb 4.8 oz (72.7 kg)   Constitutional: Well-developed, ill-appearing female in no acute distress. Chapped and cracking lips. Spitting into emesis bag during encounter.  Cardiovascular: normal rate Respiratory: normal effort GI: Abd soft, non-tender. Pos BS x 4 MS: Extremities nontender, no edema, normal ROM Neurologic: Alert and oriented x 4.  GU: Neg CVAT.  Doppler: 145 in triage  LAB RESULTS Results for orders placed or performed during the hospital encounter of 08/28/23 (from the past 24 hours)  Comprehensive metabolic panel with GFR     Status: Abnormal   Collection Time: 08/28/23 11:56 AM  Result Value Ref Range   Sodium 136 135 - 145 mmol/L   Potassium 3.7 3.5 - 5.1 mmol/L   Chloride 106 98 - 111 mmol/L   CO2 20 (L) 22 - 32 mmol/L   Glucose, Bld 81 70 - 99 mg/dL   BUN 5 (L) 6 - 20 mg/dL   Creatinine, Ser 9.46 0.44 - 1.00 mg/dL   Calcium 9.0 8.9 - 89.6 mg/dL   Total Protein 6.8 6.5 - 8.1 g/dL   Albumin 3.3 (L) 3.5 - 5.0 g/dL   AST 31 15 - 41 U/L   ALT 41 0 - 44 U/L   Alkaline  Phosphatase 78 38 - 126 U/L   Total Bilirubin 0.5 0.0 - 1.2 mg/dL   GFR, Estimated >39 >39 mL/min   Anion gap 10 5 - 15  CBC     Status: Abnormal   Collection Time: 08/28/23 11:56 AM  Result Value Ref Range   WBC 11.6 (H) 4.0 - 10.5 K/uL   RBC 3.63 (L) 3.87 - 5.11 MIL/uL   Hemoglobin 11.1 (L) 12.0 - 15.0 g/dL   HCT 67.2 (L) 63.9 - 53.9 %   MCV  90.1 80.0 - 100.0 fL   MCH 30.6 26.0 - 34.0 pg   MCHC 33.9 30.0 - 36.0 g/dL   RDW 87.6 88.4 - 84.4 %   Platelets 263 150 - 400 K/uL   nRBC 0.0 0.0 - 0.2 %  Urinalysis, Routine w reflex microscopic -Urine, Clean Catch     Status: Abnormal   Collection Time: 08/28/23 12:24 PM  Result Value Ref Range   Color, Urine YELLOW YELLOW   APPearance CLEAR CLEAR   Specific Gravity, Urine 1.027 1.005 - 1.030   pH 7.0 5.0 - 8.0   Glucose, UA 150 (A) NEGATIVE mg/dL   Hgb urine dipstick NEGATIVE NEGATIVE   Bilirubin Urine NEGATIVE NEGATIVE   Ketones, ur NEGATIVE NEGATIVE mg/dL   Protein, ur 30 (A) NEGATIVE mg/dL   Nitrite NEGATIVE NEGATIVE   Leukocytes,Ua NEGATIVE NEGATIVE   RBC / HPF 0-5 0 - 5 RBC/hpf   WBC, UA 0-5 0 - 5 WBC/hpf   Bacteria, UA NONE SEEN NONE SEEN   Squamous Epithelial / HPF 0-5 0 - 5 /HPF   Mucus PRESENT    Ca Oxalate Crys, UA PRESENT   Wet prep, genital     Status: Abnormal   Collection Time: 08/28/23 12:59 PM   Specimen: Vaginal  Result Value Ref Range   Yeast Wet Prep HPF POC NONE SEEN NONE SEEN   Trich, Wet Prep NONE SEEN NONE SEEN   Clue Cells Wet Prep HPF POC PRESENT (A) NONE SEEN   WBC, Wet Prep HPF POC >=10 (A) <10   Sperm NONE SEEN     O/Positive/-- (06/02 1140)  IMAGING No results found.  MDM: Hyperemesis gravidarum: Vomiting 8 times despite Zofran  every 4 hours with Phenergan  at night.  CMP did not find any significant electrolyte abnormalities, very mild alkalosis, CBC mild anemia with mild leukocytosis.  UA unremarkable and negative for ketones. Wet prep wnl. Less likely an infectious cause given lack of  other systemic symptoms and unremarkable CBC.  MAU Management: Orders Placed This Encounter  Procedures   Wet prep, genital   Comprehensive metabolic panel with GFR   CBC   Urinalysis, Routine w reflex microscopic -Urine, Clean Catch   EKG 12-Lead   Insert peripheral IV   Discharge patient Discharge disposition: 01-Home or Self Care; Discharge patient date: 08/28/2023    Meds ordered this encounter  Medications   lactated ringers  bolus 1,000 mL   metoCLOPramide  (REGLAN ) injection 10 mg   scopolamine  (TRANSDERM-SCOP) 1 MG/3DAYS 1.5 mg   glycopyrrolate  (ROBINUL ) injection 0.1 mg   scopolamine  (TRANSDERM-SCOP) 1 MG/3DAYS    Sig: Place 1 patch (1.5 mg total) onto the skin every 3 (three) days.    Dispense:  10 patch    Refill:  12   metoCLOPramide  (REGLAN ) 10 MG tablet    Sig: Take 1 tablet (10 mg total) by mouth 3 (three) times daily with meals.    Dispense:  90 tablet    Refill:  1   famotidine  (PEPCID ) 20 MG tablet    Sig: Take 1 tablet (20 mg total) by mouth 2 (two) times daily.    Dispense:  30 tablet    Refill:  0   glycopyrrolate  (ROBINUL ) 1 MG tablet    Sig: Take 1 tablet (1 mg total) by mouth 3 (three) times daily.    Dispense:  270 tablet    Refill:  1   Peripheral IV placed, bolused 1 L LR, given IV Reglan  with Scopolamine  patch and robinul  for salivation.  Reassessed at 14:15, pt reports spit has stopped and nausea has improved, no vomiting. Starting PO trial.  14:45, pt feeling better, no emesis, and desired to go home.  ASSESSMENT 1. Hyperemesis gravidarum before end of [redacted] week gestation with dehydration   2. [redacted] weeks gestation of pregnancy     PLAN Discharge home with strict return precautions. -Metocloperamide 10 mg PO TID -Scopolamine  patch every 3 days -Zofran  4mg  sublingual every 6 hours for breakthrough vomiting   Allergies as of 08/28/2023       Reactions   Antihistamines, Diphenhydramine-type Anaphylaxis   Blueberry Flavor [flavoring Agent]  Anaphylaxis   Citrus Hives, Other (See Comments)   Mouth Burns        Medication List     STOP taking these medications    lactulose  10 GM/15ML solution Commonly known as: CHRONULAC        TAKE these medications    albuterol  108 (90 Base) MCG/ACT inhaler Commonly known as: VENTOLIN  HFA Inhale 1 puff into the lungs every 6 (six) hours as needed for shortness of breath. I need a refill.   docusate sodium  100 MG capsule Commonly known as: Colace Take 1 capsule (100 mg total) by mouth 2 (two) times daily as needed for mild constipation.   famotidine  20 MG tablet Commonly known as: Pepcid  Take 1 tablet (20 mg total) by mouth 2 (two) times daily. What changed: when to take this   fluticasone  50 MCG/ACT nasal spray Commonly known as: FLONASE  Place 2 sprays into both nostrils daily.   glycopyrrolate  1 MG tablet Commonly known as: Robinul  Take 1 tablet (1 mg total) by mouth 3 (three) times daily.   metoCLOPramide  10 MG tablet Commonly known as: REGLAN  Take 1 tablet (10 mg total) by mouth 3 (three) times daily with meals.   ondansetron  4 MG disintegrating tablet Commonly known as: ZOFRAN -ODT Take 1 tablet (4 mg total) by mouth every 8 (eight) hours as needed for nausea or vomiting.   polyethylene glycol 17 g packet Commonly known as: MiraLax  Take 17 g by mouth daily.   Prenatal 28-0.8 MG Tabs Take 1 tablet by mouth daily.   promethazine  25 MG tablet Commonly known as: PHENERGAN  Take 1 tablet (25 mg total) by mouth every 6 (six) hours as needed for nausea or vomiting.   scopolamine  1 MG/3DAYS Commonly known as: TRANSDERM-SCOP Place 1 patch (1.5 mg total) onto the skin every 3 (three) days. Start taking on: August 31, 2023        Follow-up Information     [facility] Follow up.   Specialty: Obstetrics and Gynecology Why: As needed for emergencies Contact information: [address] [place]   [phone]        [facility] for [facility] [place]. Go to.   Specialty: Obstetrics and Gynecology Why: As scheduled Contact information: [address], [address] [place]  [address] [phone]                Fairy Amy, MD [facility] Family Medicine Resident, PGY-1 08/28/2023  7:10 PM

## 2023-08-28 NOTE — Patient Instructions (Signed)
 It was nice meeting you today! You will see your results in the MyChart app within 1 week  If you are not feeling better, please let us  know so we can see you sooner

## 2023-08-28 NOTE — Progress Notes (Signed)
 Subjective:   Vicki Clayton is a 23 y.o. G2P0010 at [redacted]w[redacted]d by LMP c/w 8wk US  being seen today for her first obstetrical visit.  Her obstetrical history is significant for BMI 32. Patient does intend to breast feed. Pregnancy history fully reviewed.  Patient reports persistent nausea/vomiting - unable to tolerate clears this morning. 18lb WL. Also notes dizziness, headaches, heart racing, increased salivation and low back pain.Is taking zofran /phenergan  TID but oftentimes vomits shortly after taking them  HISTORY: OB History  Gravida Para Term Preterm AB Living  2 0 0 0 1 0  SAB IAB Ectopic Multiple Live Births  1 0 0 0 0    # Outcome Date GA Lbr Len/2nd Weight Sex Type Anes PTL Lv  2 Current           1 SAB 04/23/23             Last pap smear: No results found for: DIAGPAP, HPV, HPVHIGH  Past Medical History:  Diagnosis Date   Anemia    Appendicitis    Heart murmur of newborn    Psychogenic nonepileptic seizure    Vasovagal syncope    Past Surgical History:  Procedure Laterality Date   ELBOW CLOSED REDUCTION W/ PERCUANEOUS PINNING     ELBOW SURGERY     KNEE ARTHROSCOPY WITH ANTERIOR CRUCIATE LIGAMENT (ACL) REPAIR Left 05/30/2019   Procedure: KNEE ARTHROSCOPY WITH ANTERIOR CRUCIATE LIGAMENT (ACL) REPAIR WITH QUADRICEPS AUTOGRAFT;  Surgeon: Cristy Bonner DASEN, MD;  Location: [facility];  Service: Orthopedics;  Laterality: Left;   Family History  Problem Relation Age of Onset   Cancer Mother    Hypertension Mother    Cancer Father    Breast cancer Maternal Grandmother    Diabetes Maternal Grandmother    Breast cancer Paternal Grandmother    Heart disease Other    Diabetes Other    Hypertension Other    Cancer Other    Migraines Neg Hx    Seizures Neg Hx    Depression Neg Hx    Anxiety disorder Neg Hx    Bipolar disorder Neg Hx    Schizophrenia Neg Hx    ADD / ADHD Neg Hx    Autism Neg Hx    Social History   Tobacco Use   Smoking  status: Never    Passive exposure: Never   Smokeless tobacco: Never  Vaping Use   Vaping status: Never Used  Substance Use Topics   Alcohol use: No   Drug use: No   Allergies  Allergen Reactions   Antihistamines, Diphenhydramine-Type Anaphylaxis   Blueberry Flavor [Flavoring Agent] Anaphylaxis   Citrus Hives and Other (See Comments)    Mouth Burns   No current facility-administered medications on file prior to visit.   Current Outpatient Medications on File Prior to Visit  Medication Sig Dispense Refill   albuterol  (VENTOLIN  HFA) 108 (90 Base) MCG/ACT inhaler Inhale 1 puff into the lungs every 6 (six) hours as needed for shortness of breath. I need a refill. (Patient not taking: Reported on 05/11/2023)     docusate sodium  (COLACE) 100 MG capsule Take 1 capsule (100 mg total) by mouth 2 (two) times daily as needed for mild constipation. (Patient not taking: Reported on 08/28/2023) 10 capsule 0   famotidine  (PEPCID ) 20 MG tablet Take 1 tablet (20 mg total) by mouth daily. (Patient not taking: Reported on 08/28/2023) 30 tablet 0   fluticasone  (FLONASE ) 50 MCG/ACT nasal spray Place 2 sprays  into both nostrils daily. (Patient not taking: Reported on 05/11/2023)     lactulose  (CHRONULAC ) 10 GM/15ML solution Take 30 mLs (20 g total) by mouth daily as needed for mild constipation. (Patient not taking: Reported on 08/28/2023) 120 mL 0   ondansetron  (ZOFRAN -ODT) 4 MG disintegrating tablet Take 1 tablet (4 mg total) by mouth every 8 (eight) hours as needed for nausea or vomiting. 12 tablet 0   polyethylene glycol (MIRALAX ) 17 g packet Take 17 g by mouth daily. (Patient not taking: Reported on 05/11/2023) 14 each 0   Prenatal 28-0.8 MG TABS Take 1 tablet by mouth daily. 30 tablet 12   promethazine  (PHENERGAN ) 25 MG tablet Take 1 tablet (25 mg total) by mouth every 6 (six) hours as needed for nausea or vomiting. 30 tablet 1     Exam   Vitals:   08/28/23 1000  BP: 103/70  Pulse: (!) 111   Weight: 160 lb (72.6 kg)   Fetal Heart Rate (bpm): 140  General:  Alert, oriented and cooperative. Patient is in no acute distress.  Breast: Deferred  Cardiovascular: Mild tachycardia noted  Respiratory: Normal respiratory effort, no problems with respiration noted  Abdomen: Soft non tender  Pelvic: Deferred        Extremities: Normal range of motion.     Mental Status: Normal mood and affect. Normal behavior. Normal judgment and thought content.    Assessment:   Pregnancy: G2P0010 Patient Active Problem List   Diagnosis Date Noted   History of heart murmur in childhood 08/28/2023   History of seizure 08/28/2023   Hyperemesis gravidarum 08/28/2023   Supervision of other normal pregnancy, antepartum 07/03/2023     Plan:  1. Supervision of other normal pregnancy, antepartum (Primary) 2. [redacted] weeks gestation of pregnancy Initial labs reviewed and normal. Continue prenatal vitamins. Genetic Screening discussed: NIPS/carrier screening done and normal. Accepts AFP today Ultrasound discussed; fetal anatomic survey: ordered. Problem list reviewed and updated. The nature of [facility] with multiple MDs and other Advanced Practice Providers was explained to patient; also emphasized that residents, students are part of our team. Routine obstetric precautions reviewed. Patient would like to transfer care to [facility]) office for convenience as she lives in [place] - AFP, Serum, Open Spina Bifida - US  MFM OB COMP + 14 WK; Future  3. History of heart murmur in childhood Dx with functional murmur by pediatric cardiology 2015 Normal echo 2015, last EKG 2021 normal  4. History of psychogenic non epileptic seizure - Reports frequent episodes of losing consciousness and seizures in the past - S/p cards & neuro eval - felt to be vasovagal syncope and psychogenic non-epileptiform seizures - Normal sleep-deprived EEG with neuro 05/19/20 -  Positive anxiety/depression screen - [facility] referral placed. Has followed with Jamie in the past  5. Hyperemesis gravidarum - Reports good days and bad days with 18lb weight loss despite taking zofran  & phenergan  TID.   - Vomited her breakfast and all liquids this morning. Getting some relief from lifesavers - Mild tachycardia noted on exam today; has si/sx dehydration with headaches and dizziness - Will send to MAU for eval - CMP, EKG, IV fluids, IV antiemetics and electrolyte repletion prn  Return in about 5 weeks (around 10/02/2023) for return OB at 23 weeks - please schedule at [facility] office.  Kieth Carolin, MD Obstetrician & Gynecologist, [facility] for [employer], [facility]

## 2023-08-29 LAB — GC/CHLAMYDIA PROBE AMP (~~LOC~~) NOT AT ARMC
Chlamydia: NEGATIVE
Comment: NEGATIVE
Comment: NORMAL
Neisseria Gonorrhea: NEGATIVE

## 2023-08-29 MED ORDER — B-6 50 MG PO TABS: 50.0000 mg | ORAL_TABLET | Freq: Three times a day (TID) | ORAL | 11 refills | Status: DC

## 2023-08-30 ENCOUNTER — Ambulatory Visit: Payer: Self-pay | Admitting: Obstetrics and Gynecology

## 2023-08-30 LAB — AFP, SERUM, OPEN SPINA BIFIDA
AFP MoM: 1.32
AFP Value: 60.9 ng/mL
Gest. Age on Collection Date: 18.1 wk
Maternal Age At EDD: 21.7 a
OSBR Risk 1 IN: 9058
Test Results:: NEGATIVE
Weight: 160 [lb_av]

## 2023-09-01 ENCOUNTER — Ambulatory Visit: Payer: Self-pay | Admitting: Obstetrics and Gynecology

## 2023-09-01 DIAGNOSIS — Z3480 Encounter for supervision of other normal pregnancy, unspecified trimester: Secondary | ICD-10-CM

## 2023-09-25 DIAGNOSIS — O99210 Obesity complicating pregnancy, unspecified trimester: Secondary | ICD-10-CM | POA: Insufficient documentation

## 2023-10-04 ENCOUNTER — Encounter: Payer: Self-pay | Admitting: Advanced Practice Midwife

## 2023-10-05 ENCOUNTER — Encounter: Payer: Self-pay | Admitting: Obstetrics & Gynecology

## 2023-10-05 ENCOUNTER — Ambulatory Visit (INDEPENDENT_AMBULATORY_CARE_PROVIDER_SITE_OTHER): Admitting: Obstetrics & Gynecology

## 2023-10-05 VITALS — BP 114/76 | HR 108 | Wt 174.0 lb

## 2023-10-05 DIAGNOSIS — R12 Heartburn: Secondary | ICD-10-CM | POA: Diagnosis not present

## 2023-10-05 DIAGNOSIS — Z3A23 23 weeks gestation of pregnancy: Secondary | ICD-10-CM | POA: Diagnosis not present

## 2023-10-05 DIAGNOSIS — O26892 Other specified pregnancy related conditions, second trimester: Secondary | ICD-10-CM

## 2023-10-05 DIAGNOSIS — Z3480 Encounter for supervision of other normal pregnancy, unspecified trimester: Secondary | ICD-10-CM

## 2023-10-05 MED ORDER — PANTOPRAZOLE SODIUM 20 MG PO TBEC: 20.0000 mg | DELAYED_RELEASE_TABLET | Freq: Two times a day (BID) | ORAL | 3 refills | Status: DC

## 2023-10-05 NOTE — Progress Notes (Signed)
   PRENATAL VISIT NOTE  Subjective:  Vicki Clayton is a 23 y.o. G2P0010 at [redacted]w[redacted]d being seen today for ongoing prenatal care.  She is currently monitored for the following issues for this low-risk pregnancy and has Supervision of other normal pregnancy, antepartum; History of heart murmur in childhood; History of seizure; Hyperemesis gravidarum; and Obesity affecting pregnancy, antepartum on their problem list.  Patient reports heartburn symptoms, not alleviated by Pepcid .  Also some bilateral leg edema after standing for a long time, but this resolves. Contractions: Not present. Vag. Bleeding: None.  Movement: Present. Denies leaking of fluid.   The following portions of the patient's history were reviewed and updated as appropriate: allergies, current medications, past family history, past medical history, past social history, past surgical history and problem list.   Objective:    Vitals:   10/05/23 1345  BP: 114/76  Pulse: (!) 108  Weight: 174 lb (78.9 kg)    Fetal Status:  Fetal Heart Rate (bpm): 153   Movement: Present    General: Alert, oriented and cooperative. Patient is in no acute distress.  Skin: Skin is warm and dry. No rash noted.   Cardiovascular: Normal heart rate noted  Respiratory: Normal respiratory effort, no problems with respiration noted  Abdomen: Soft, gravid, appropriate for gestational age.  Pain/Pressure: Absent     Pelvic: Cervical exam deferred        Extremities: Normal range of motion.  Edema: None (When up moving around)  Mental Status: Normal mood and affect. Normal behavior. Normal judgment and thought content.   Assessment and Plan:  Pregnancy: G2P0010 at [redacted]w[redacted]d 1. Heartburn during pregnancy in second trimester Protonix  prescribed, will monitor closely. - pantoprazole  (PROTONIX ) 20 MG tablet; Take 1 tablet (20 mg total) by mouth 2 (two) times daily.  Dispense: 60 tablet; Refill: 3  2. [redacted] weeks gestation of pregnancy 3. Supervision of other  normal pregnancy, antepartum (Primary) Reassured about edema caused by pressure of gravid uterus on pelvic blood vessels. Advised to get evaluation for asymmetric swelling, pain or other concerning symptoms. Preterm labor symptoms and general obstetric precautions including but not limited to vaginal bleeding, contractions, leaking of fluid and fetal movement were reviewed in detail with the patient. Please refer to After Visit Summary for other counseling recommendations.   Return in about 4 weeks (around 11/02/2023) for 2 hr GTT, 3rd trimester labs, TDap, OFFICE OB VISIT (MD or APP).  Future Appointments  Date Time Provider Department Center  10/09/2023  9:00 AM [facility] PROVIDER 1 [facility]  10/09/2023  9:30 AM [facility] US4 [facility]  11/01/2023  8:30 AM [facility]  11/01/2023  8:55 AM Emilio Delilah HERO, CNM [facility]  11/15/2023  8:35 AM Emilio Delilah HERO, CNM [facility]  11/29/2023  8:35 AM Letha Renshaw, CNM [facility]    Gloris Hugger, MD

## 2023-10-05 NOTE — Patient Instructions (Signed)
 Oral Glucose Tolerance Test During Pregnancy Why am I having this test? The oral glucose tolerance test (GTT) is done to check how your body processes blood sugar (glucose). This is one of several tests used to diagnose diabetes that develops during pregnancy (gestational diabetes mellitus). Gestational diabetes is a short-term form of diabetes that some women develop while they are pregnant. It usually occurs during the second or third trimester of pregnancy and goes away after delivery. Testing, or screening, for gestational diabetes usually occurs around 69 of pregnancy. This test may also be needed earlier if: You have a history of gestational diabetes. There is a history of giving birth to very large babies or of losing pregnancies (having stillbirths). You have signs and symptoms of diabetes, such as: Changes in your eyesight. Tingling or numbness in your hands or feet. Changes in hunger, thirst, and urination, and these are not explained by your pregnancy. What is being tested? This test measures the amount of glucose in your blood at different times during a period of 2 hours. This shows how well your body can process glucose.  You will have three separate blood draws. What kind of sample is taken?  Blood samples are required for this test. They are usually collected by inserting a needle into a blood vessel. How do I prepare for this test? For 3 days before your test, eat normally. Have plenty of carbohydrate-rich foods. You will be asked not to eat or drink anything other than water (to fast) starting 8-10 hours before the test. Tell a health care provider about: All medicines you are taking, including vitamins, herbs, eye drops, creams, and over-the-counter medicines. Any blood disorders you have. Any surgeries you have had. Any medical conditions you have. What happens during the test? First, your blood glucose will be measured. This is referred to as your fasting blood glucose  because you fasted before the test. Then, you will drink a glucose solution that contains a certain amount of glucose. Your blood glucose will be measured again 1 and 2 hours after you drink the solution. This test takes about 2 hours to complete. You will need to stay at the testing location during this time. During the testing period: Do not eat or drink anything other than the glucose solution. Do not exercise. Do not use any products that contain nicotine or tobacco, such as cigarettes, e-cigarettes, and chewing tobacco. These can affect your test results. If you need help quitting, ask your health care provider. The testing procedure may vary among health care providers and hospitals. How are the results reported? Your results will be reported as milligrams of glucose per deciliter of blood (mg/dL) or millimoles per liter (mmol/L). There is more than one source for screening and diagnosis reference values used to diagnose gestational diabetes. Your health care provider will compare your results to normal values that were established after testing a large group of people (reference values). Reference values may vary among labs and hospitals. For this test, reference values are: Fasting: 92 mg/dL 1 hour: 161 mg/dL  2 hour: 096 mg/dL   What do the results mean? Results below the reference values are considered normal. If one or more of your blood glucose levels are at or above the reference values, you will be diagnosed with gestational diabetes.  Talk with your health care provider about what your results mean. Questions to ask your health care provider Ask your health care provider, or the department that is doing the test: When  will my results be ready? How will I get my results? What are my treatment options? What other tests do I need? What are my next steps? Summary The oral glucose tolerance test (GTT) is one of several tests used to diagnose diabetes that develops during pregnancy  (gestational diabetes mellitus). Gestational diabetes is a short-term form of diabetes that some women develop while they are pregnant. You may also have this test if you have any symptoms or risk factors for this type of diabetes. Talk with your health care provider about what your results mean. This information is not intended to replace advice given to you by your health care provider. Make sure you discuss any questions you have with your health care provider.  TDaP Vaccine Pregnancy Get the Whooping Cough Vaccine While You Are Pregnant (CDC)  It is important for women to get the whooping cough vaccine in the third trimester of each pregnancy. Vaccines are the best way to prevent this disease. There are 2 different whooping cough vaccines. Both vaccines combine protection against whooping cough, tetanus and diphtheria, but they are for different age groups: Tdap: for everyone 11 years or older, including pregnant women  DTaP: for children 2 months through 45 years of age  You need the whooping cough vaccine during each of your pregnancies The recommended time to get the shot is during your 27th through 36th week of pregnancy, preferably during the earlier part of this time period. The Centers for Disease Control and Prevention (CDC) recommends that pregnant women receive the whooping cough vaccine for adolescents and adults (called Tdap vaccine) during the third trimester of each pregnancy. The recommended time to get the shot is during your 27th through 36th week of pregnancy, preferably during the earlier part of this time period. This replaces the original recommendation that pregnant women get the vaccine only if they had not previously received it. The [employer] and the [employer] support this recommendation.  You should get the whooping cough vaccine while pregnant to pass protection to your baby frame support disabled  and/or not supported in this browser  Learn why Vernona Rieger decided to get the whooping cough vaccine in her 3rd trimester of pregnancy and how her baby girl was born with some protection against the disease. Also available on YouTube. After receiving the whooping cough vaccine, your body will create protective antibodies (proteins produced by the body to fight off diseases) and pass some of them to your baby before birth. These antibodies provide your baby some short-term protection against whooping cough in early life. These antibodies can also protect your baby from some of the more serious complications that come along with whooping cough. Your protective antibodies are at their highest about 2 weeks after getting the vaccine, but it takes time to pass them to your baby. So the preferred time to get the whooping cough vaccine is early in your third trimester. The amount of whooping cough antibodies in your body decreases over time. That is why CDC recommends you get a whooping cough vaccine during each pregnancy. Doing so allows each of your babies to get the greatest number of protective antibodies from you. This means each of your babies will get the best protection possible against this disease.  Getting the whooping cough vaccine while pregnant is better than getting the vaccine after you give birth Whooping cough vaccination during pregnancy is ideal so your baby will have short-term protection as soon as he  is born. This early protection is important because your baby will not start getting his whooping cough vaccines until he is 2 months old. These first few months of life are when your baby is at greatest risk for catching whooping cough. This is also when he's at greatest risk for having severe, potentially life-threating complications from the infection. To avoid that gap in protection, it is best to get a whooping cough vaccine during pregnancy. You will then pass protection to your baby before he  is born. To continue protecting your baby, he should get whooping cough vaccines starting at 2 months old. You may never have gotten the Tdap vaccine before and did not get it during this pregnancy. If so, you should make sure to get the vaccine immediately after you give birth, before leaving the hospital or birthing center. It will take about 2 weeks before your body develops protection (antibodies) in response to the vaccine. Once you have protection from the vaccine, you are less likely to give whooping cough to your newborn while caring for him. But remember, your baby will still be at risk for catching whooping cough from others. A recent study looked to see how effective Tdap was at preventing whooping cough in babies whose mothers got the vaccine while pregnant or in the hospital after giving birth. The study found that getting Tdap between 27 through 36 weeks of pregnancy is 85% more effective at preventing whooping cough in babies younger than 2 months old. Blood tests cannot tell if you need a whooping cough vaccine There are no blood tests that can tell you if you have enough antibodies in your body to protect yourself or your baby against whooping cough. Even if you have been sick with whooping cough in the past or previously received the vaccine, you still should get the vaccine during each pregnancy. Breastfeeding may pass some protective antibodies onto your baby By breastfeeding, you may pass some antibodies you have made in response to the vaccine to your baby. When you get a whooping cough vaccine during your pregnancy, you will have antibodies in your breast milk that you can share with your baby as soon as your milk comes in. However, your baby will not get protective antibodies immediately if you wait to get the whooping cough vaccine until after delivering your baby. This is because it takes about 2 weeks for your body to create antibodies. Learn more about the health benefits of  breastfeeding.

## 2023-10-09 ENCOUNTER — Ambulatory Visit: Payer: Self-pay | Attending: Obstetrics and Gynecology | Admitting: Obstetrics

## 2023-10-09 ENCOUNTER — Other Ambulatory Visit: Payer: Self-pay | Admitting: *Deleted

## 2023-10-09 ENCOUNTER — Ambulatory Visit (HOSPITAL_BASED_OUTPATIENT_CLINIC_OR_DEPARTMENT_OTHER): Payer: Self-pay

## 2023-10-09 VITALS — BP 112/63 | HR 104

## 2023-10-09 DIAGNOSIS — Z363 Encounter for antenatal screening for malformations: Secondary | ICD-10-CM | POA: Diagnosis not present

## 2023-10-09 DIAGNOSIS — Z3A24 24 weeks gestation of pregnancy: Secondary | ICD-10-CM | POA: Diagnosis not present

## 2023-10-09 DIAGNOSIS — O99210 Obesity complicating pregnancy, unspecified trimester: Secondary | ICD-10-CM

## 2023-10-09 DIAGNOSIS — Z3A18 18 weeks gestation of pregnancy: Secondary | ICD-10-CM

## 2023-10-09 DIAGNOSIS — E669 Obesity, unspecified: Secondary | ICD-10-CM

## 2023-10-09 DIAGNOSIS — O43192 Other malformation of placenta, second trimester: Secondary | ICD-10-CM | POA: Diagnosis not present

## 2023-10-09 DIAGNOSIS — Z3480 Encounter for supervision of other normal pregnancy, unspecified trimester: Secondary | ICD-10-CM

## 2023-10-09 DIAGNOSIS — O99212 Obesity complicating pregnancy, second trimester: Secondary | ICD-10-CM | POA: Diagnosis present

## 2023-10-09 NOTE — Progress Notes (Signed)
 MFM Consult Note  Vicki Clayton is currently at 24 weeks and 1 day.  Vicki Clayton was seen for a detailed fetal anatomy scan due to maternal obesity with a BMI of 35.3.  Her current pregnancy has been complicated by hyperemesis.  Vicki Clayton reports that her nausea and vomiting is improving, however Vicki Clayton still feels nauseous when Vicki Clayton has acid reflux.  Vicki Clayton had a cell free DNA test earlier in her pregnancy which indicated a low risk for trisomy 46, 15, and 13. A female fetus is predicted.   Sonographic findings Single intrauterine pregnancy at 24w 1d. Fetal cardiac activity:  Observed and appears normal. Presentation: Breech. The anatomic structures that were well seen appear normal without evidence of soft markers. The anatomic survey is complete.  Fetal biometry shows the estimated fetal weight at the 27 percentile. Amniotic fluid: Within normal limits.  MVP: 4.62 cm. Placenta: Posterior Fundal. Adnexa: No adnexal mass visualized. Cervical length: 3.1 cm.  The patient was informed that anomalies may be missed due to technical limitations. If the fetus is in a suboptimal position or maternal habitus is increased, visualization of the fetus in the maternal uterus may be impaired.  A marginal placental cord insertion was noted on today's exam.  The small risk of fetal growth restriction due to the marginal cord insertion was discussed.  Vicki Clayton was advised to continue taking antacids as needed to help relieve her nausea and vomiting.  Vicki Clayton was reassured that any of the over-the-counter antacids that Vicki Clayton can purchase at the pharmacy is safe to take in pregnancy.    Due to the marginal placental cord insertion and maternal obesity, a follow-up growth scan was scheduled in 6 weeks.    The patient stated that all of her questions were answered today.  A total of 30 minutes was spent counseling and coordinating the care for this patient.  Greater than 50% of the time was spent in direct face-to-face contact.

## 2023-10-22 ENCOUNTER — Encounter: Payer: Self-pay | Admitting: Obstetrics and Gynecology

## 2023-10-22 ENCOUNTER — Encounter: Payer: Self-pay | Admitting: Obstetrics & Gynecology

## 2023-10-22 ENCOUNTER — Observation Stay
Admission: EM | Admit: 2023-10-22 | Discharge: 2023-10-22 | Disposition: A | Discharge status: Home / Self Care | Attending: Obstetrics and Gynecology | Admitting: Obstetrics and Gynecology

## 2023-10-22 DIAGNOSIS — O26852 Spotting complicating pregnancy, second trimester: Secondary | ICD-10-CM | POA: Diagnosis present

## 2023-10-22 DIAGNOSIS — Z3A26 26 weeks gestation of pregnancy: Secondary | ICD-10-CM | POA: Diagnosis not present

## 2023-10-22 DIAGNOSIS — O26853 Spotting complicating pregnancy, third trimester: Principal | ICD-10-CM | POA: Diagnosis present

## 2023-10-22 DIAGNOSIS — O368120 Decreased fetal movements, second trimester, not applicable or unspecified: Secondary | ICD-10-CM | POA: Diagnosis not present

## 2023-10-22 LAB — WET PREP, GENITAL
Clue Cells Wet Prep HPF POC: NONE SEEN
Sperm: NONE SEEN
Trich, Wet Prep: NONE SEEN
WBC, Wet Prep HPF POC: <10 (ref <10)
Yeast Wet Prep HPF POC: NONE SEEN

## 2023-10-22 LAB — FETAL FIBRONECTIN: Fetal Fibronectin: POSITIVE — AB

## 2023-10-22 LAB — CHLAMYDIA/NGC RT PCR (ARMC ONLY)
Chlamydia Tr: NOT DETECTED
N gonorrhoeae: NOT DETECTED

## 2023-10-22 MED ORDER — ONDANSETRON 4 MG PO TBDP
4.0000 mg | ORAL_TABLET | Freq: Four times a day (QID) | ORAL | Status: DC | PRN
  Administered 2023-10-22: 4 mg via ORAL
  Filled 2023-10-22: qty 1

## 2023-10-22 NOTE — OB Triage Note (Addendum)
 G2P0 at 26.0 week reports vaginal bleeding and decreased FM since last night. Pain is reported as 9/10 similar to her period pains of a 10/10. Known abruption in the first trimester with VB. No bleeding since 10 weeks. Also endorsing dizziness at rest.

## 2023-10-22 NOTE — OB Triage Note (Signed)
 Patient discharged by Driscoll, CNM. Patient denies further vaginal bleeding. Return precautions discussed, discharge teaching discussed, patient verbalizes understanding to all and denies further needs and questions at this time. Patient ambulated off unit accompanied by significant other, discharge teachings in hand, vss.

## 2023-10-22 NOTE — Discharge Summary (Signed)
 Patient ID: Kylani Wires MRN: [id number] DOB/AGE: Oct 22, 2000 23 y.o.  Admit date: 10/22/2023 Discharge date: 10/22/2023  Admission Diagnoses: 23yo G2P0 at [redacted]w[redacted]d presents with reports of vaginal spotting, menstrual cramping (but less than her normal periods), and  decreased fetal movement. Prenatal appointments at [facility]  Discharge Diagnoses: No addition bleeding since admission   Factors complicating pregnancy: History of [facility] in may though not seen on MFM u/s 10/09/23 Obesity  Prenatal Procedures: NST  Consults: None  Significant Diagnostic Studies:  Results for orders placed or performed during the hospital encounter of 10/22/23 (from the past week)  Wet prep, genital   Collection Time: 10/22/23  5:37 PM   Specimen: Vaginal  Result Value Ref Range   Yeast Wet Prep HPF POC NONE SEEN NONE SEEN   Trich, Wet Prep NONE SEEN NONE SEEN   Clue Cells Wet Prep HPF POC NONE SEEN NONE SEEN   WBC, Wet Prep HPF POC <10 <10   Sperm NONE SEEN   Fetal fibronectin   Collection Time: 10/22/23  5:37 PM  Result Value Ref Range   Fetal Fibronectin POSITIVE (A) NEGATIVE    Treatments: antiemetic   Hospital Course:  This is a 23 y.o. G2P0010 with IUP at [redacted]w[redacted]d seen for vaginal spotting, menstrual cramping, and  decreased fetal movement.   Speculum exam - showed closed cervix with thick white discharge, no blood noted NST - reactive Labs - WNL She was observed, fetal heart rate monitoring remained reassuring, and she had no signs/symptoms of preterm labor or other maternal-fetal concerns.  She was deemed stable for discharge to home with outpatient follow up.  Discharge Physical Exam:  LMP 04/23/2023   General: NAD CV: RRR Pulm: nl effort ABD: s/nd/nt, gravid DVT Evaluation: LE non-ttp, no evidence of DVT on exam.  NST: FHR baseline: 155 bpm Variability: moderate Accelerations: yes  10x10s Decelerations: none Category/reactivity: reactive  TOCO: quiet SVE:  Dilation: Closed  (per SSE)   Discharge Condition: Stable  Disposition:  Discharge disposition: 01-Home or Self Care        Allergies as of 10/22/2023       Reactions   Antihistamines, Diphenhydramine-type Anaphylaxis   Blueberry Flavor [flavoring Agent] Anaphylaxis   Citrus Hives, Other (See Comments)   Mouth Burns        Medication List     STOP taking these medications    albuterol  108 (90 Base) MCG/ACT inhaler Commonly known as: VENTOLIN  HFA   B-6 50 MG Tabs   fluticasone  50 MCG/ACT nasal spray Commonly known as: FLONASE    polyethylene glycol 17 g packet Commonly known as: MiraLax    scopolamine  1 MG/3DAYS Commonly known as: TRANSDERM-SCOP       TAKE these medications    docusate sodium  100 MG capsule Commonly known as: Colace Take 1 capsule (100 mg total) by mouth 2 (two) times daily as needed for mild constipation.   famotidine  20 MG tablet Commonly known as: Pepcid  Take 1 tablet (20 mg total) by mouth 2 (two) times daily.   glycopyrrolate  1 MG tablet Commonly known as: Robinul  Take 1 tablet (1 mg total) by mouth 3 (three) times daily.   metoCLOPramide  10 MG tablet Commonly known as: REGLAN  Take 1 tablet (10 mg total) by mouth 3 (three) times daily with meals.   ondansetron  4 MG disintegrating tablet Commonly known as: ZOFRAN -ODT Take 1 tablet (4 mg total) by mouth every 8 (eight) hours as needed for nausea or vomiting.   pantoprazole  20 MG tablet Commonly known  as: Protonix  Take 1 tablet (20 mg total) by mouth 2 (two) times daily.   Prenatal 28-0.8 MG Tabs Take 1 tablet by mouth daily.        Follow-up Information     [facility]. Schedule an appointment as soon as possible for a visit in 1 day(s).   Specialty: Obstetrics and Gynecology Why: to follow up for vaginal bleeding Contact information: [address] [place]  [phone]                 Signed:  DELON MYRON HOWARD 10/22/2023 7:17 PM

## 2023-10-24 LAB — CULTURE, BETA STREP (GROUP B ONLY)

## 2023-10-27 ENCOUNTER — Inpatient Hospital Stay (HOSPITAL_COMMUNITY)
Admission: AD | Admit: 2023-10-27 | Discharge: 2023-10-27 | Disposition: A | Discharge status: Home / Self Care | Attending: Obstetrics & Gynecology | Admitting: Obstetrics & Gynecology

## 2023-10-27 ENCOUNTER — Encounter (HOSPITAL_COMMUNITY): Payer: Self-pay | Admitting: Obstetrics & Gynecology

## 2023-10-27 ENCOUNTER — Other Ambulatory Visit: Payer: Self-pay

## 2023-10-27 DIAGNOSIS — O26899 Other specified pregnancy related conditions, unspecified trimester: Secondary | ICD-10-CM

## 2023-10-27 DIAGNOSIS — R109 Unspecified abdominal pain: Secondary | ICD-10-CM

## 2023-10-27 DIAGNOSIS — O4703 False labor before 37 completed weeks of gestation, third trimester: Secondary | ICD-10-CM | POA: Diagnosis not present

## 2023-10-27 DIAGNOSIS — Z3A26 26 weeks gestation of pregnancy: Secondary | ICD-10-CM | POA: Diagnosis not present

## 2023-10-27 DIAGNOSIS — O26892 Other specified pregnancy related conditions, second trimester: Secondary | ICD-10-CM

## 2023-10-27 DIAGNOSIS — O4702 False labor before 37 completed weeks of gestation, second trimester: Secondary | ICD-10-CM | POA: Insufficient documentation

## 2023-10-27 DIAGNOSIS — O212 Late vomiting of pregnancy: Secondary | ICD-10-CM | POA: Insufficient documentation

## 2023-10-27 DIAGNOSIS — R1030 Lower abdominal pain, unspecified: Secondary | ICD-10-CM | POA: Insufficient documentation

## 2023-10-27 LAB — URINALYSIS, ROUTINE W REFLEX MICROSCOPIC
Bilirubin Urine: NEGATIVE
Glucose, UA: NEGATIVE mg/dL
Hgb urine dipstick: NEGATIVE
Ketones, ur: NEGATIVE mg/dL
Nitrite: NEGATIVE
Protein, ur: NEGATIVE mg/dL
Specific Gravity, Urine: 1.011 (ref 1.005–1.030)
pH: 6 (ref 5.0–8.0)

## 2023-10-27 MED ORDER — FAMOTIDINE IN NACL 20-0.9 MG/50ML-% IV SOLN
20.0000 mg | Freq: Once | INTRAVENOUS | Status: AC
  Administered 2023-10-27: 20 mg via INTRAVENOUS
  Filled 2023-10-27: qty 50

## 2023-10-27 MED ORDER — SODIUM CHLORIDE 0.9 % IV SOLN
12.5000 mg | Freq: Once | INTRAVENOUS | Status: AC
  Administered 2023-10-27: 12.5 mg via INTRAVENOUS
  Filled 2023-10-27: qty 0.5

## 2023-10-27 MED ORDER — METOCLOPRAMIDE HCL 5 MG/ML IJ SOLN: 10.0000 mg | Freq: Once | INTRAMUSCULAR | Status: DC

## 2023-10-27 MED ORDER — LACTATED RINGERS IV BOLUS
1000.0000 mL | Freq: Once | INTRAVENOUS | Status: AC
  Administered 2023-10-27: 1000 mL via INTRAVENOUS

## 2023-10-27 MED ORDER — GLYCOPYRROLATE 0.2 MG/ML IJ SOLN
0.2000 mg | Freq: Once | INTRAMUSCULAR | Status: AC
  Administered 2023-10-27: 0.2 mg via INTRAVENOUS
  Filled 2023-10-27: qty 1

## 2023-10-27 NOTE — MAU Provider Note (Signed)
 Chief Complaint:  Abdominal Pain and Nausea   Event Date/Time   First Provider Initiated Contact with Patient 10/27/23 0202      HPI: Vicki Clayton is a 23 y.o. G2P0010 at [redacted]w[redacted]d by LMP who presents to maternity admissions reporting sharp lower abdominal pain and nausea/vomiting. She was seen at [facility] on 10/22/23 with cramping and had positive fetal fibronectin but was also reporting spotting.  She denies any bleeding since that visit.  Gonorrhea/chlamydia and wet prep were negative on 10/22/23 as well . She reports good fetal movement, denies LOF, vaginal bleeding, vaginal itching/burning.  HPI  Past Medical History: Past Medical History:  Diagnosis Date   Anemia    Appendicitis    Heart murmur of newborn    Psychogenic nonepileptic seizure    Vasovagal syncope     Past obstetric history: OB History  Gravida Para Term Preterm AB Living  2 0 0 0 1 0  SAB IAB Ectopic Multiple Live Births  1 0 0 0 0    # Outcome Date GA Lbr Len/2nd Weight Sex Type Anes PTL Lv  2 Current           1 SAB 04/23/23            Past Surgical History: Past Surgical History:  Procedure Laterality Date   ELBOW CLOSED REDUCTION W/ PERCUANEOUS PINNING     ELBOW SURGERY     KNEE ARTHROSCOPY WITH ANTERIOR CRUCIATE LIGAMENT (ACL) REPAIR Left 05/30/2019   Procedure: KNEE ARTHROSCOPY WITH ANTERIOR CRUCIATE LIGAMENT (ACL) REPAIR WITH QUADRICEPS AUTOGRAFT;  Surgeon: Cristy Bonner DASEN, MD;  Location: [facility];  Service: Orthopedics;  Laterality: Left;    Family History: Family History  Problem Relation Age of Onset   Cancer Mother    Hypertension Mother    Cancer Father    Breast cancer Maternal Grandmother    Diabetes Maternal Grandmother    Breast cancer Paternal Grandmother    Heart disease Other    Diabetes Other    Hypertension Other    Cancer Other    Migraines Neg Hx    Seizures Neg Hx    Depression Neg Hx    Anxiety disorder Neg Hx    Bipolar disorder Neg  Hx    Schizophrenia Neg Hx    ADD / ADHD Neg Hx    Autism Neg Hx     Social History: Social History   Tobacco Use   Smoking status: Never    Passive exposure: Never   Smokeless tobacco: Never  Vaping Use   Vaping status: Never Used  Substance Use Topics   Alcohol use: No   Drug use: No    Allergies:  Allergies  Allergen Reactions   Antihistamines, Diphenhydramine-Type Anaphylaxis   Blueberry Flavor [Flavoring Agent] Anaphylaxis   Citrus Hives and Other (See Comments)    Mouth Burns    Meds:  Medications Prior to Admission  Medication Sig Dispense Refill Last Dose/Taking   docusate sodium  (COLACE) 100 MG capsule Take 1 capsule (100 mg total) by mouth 2 (two) times daily as needed for mild constipation. (Patient not taking: Reported on 10/05/2023) 10 capsule 0    famotidine  (PEPCID ) 20 MG tablet Take 1 tablet (20 mg total) by mouth 2 (two) times daily. 30 tablet 0    glycopyrrolate  (ROBINUL ) 1 MG tablet Take 1 tablet (1 mg total) by mouth 3 (three) times daily. 270 tablet 1    metoCLOPramide  (REGLAN ) 10 MG tablet Take 1 tablet (  10 mg total) by mouth 3 (three) times daily with meals. 90 tablet 1    ondansetron  (ZOFRAN -ODT) 4 MG disintegrating tablet Take 1 tablet (4 mg total) by mouth every 8 (eight) hours as needed for nausea or vomiting. 12 tablet 0    pantoprazole  (PROTONIX ) 20 MG tablet Take 1 tablet (20 mg total) by mouth 2 (two) times daily. 60 tablet 3    Prenatal 28-0.8 MG TABS Take 1 tablet by mouth daily. 30 tablet 12     ROS:  Review of Systems  Constitutional:  Negative for chills, fatigue and fever.  Eyes:  Negative for visual disturbance.  Respiratory:  Negative for shortness of breath.   Cardiovascular:  Negative for chest pain.  Gastrointestinal:  Positive for abdominal pain, nausea and vomiting.  Genitourinary:  Negative for difficulty urinating, dysuria, flank pain, pelvic pain, vaginal bleeding, vaginal discharge and vaginal pain.  Neurological:   Negative for dizziness and headaches.  Psychiatric/Behavioral: Negative.       I have reviewed patient's Past Medical Hx, Surgical Hx, Family Hx, Social Hx, medications and allergies.   Physical Exam  Patient Vitals for the past 24 hrs:  BP Temp Temp src Pulse Resp SpO2 Height Weight  10/27/23 0731 (!) 106/59 -- -- 82 18 -- -- --  10/27/23 0605 102/60 98 F (36.7 C) Oral 82 17 -- -- --  10/27/23 0604 -- -- -- -- -- 98 % -- --  10/27/23 0415 (!) 103/47 -- -- 83 18 99 % -- --  10/27/23 0038 108/60 98.1 F (36.7 C) Oral (!) 103 17 99 % -- --  10/27/23 0034 -- -- -- -- -- -- 4' 11.5 (1.511 m) 78.5 kg   Constitutional: Well-developed, well-nourished female in no acute distress.  Cardiovascular: normal rate Respiratory: normal effort GI: Abd soft, non-tender, gravid appropriate for gestational age.  MS: Extremities nontender, no edema, normal ROM Neurologic: Alert and oriented x 4.  GU: Neg CVAT.  PELVIC EXAM:   Dilation: Fingertip Effacement (%): 60 Station: -3 Exam by:: L. Rollie Fuelling CNM  FHT:  Baseline 150 , moderate variability, accelerations present, no decelerations Contractions: none on toco or to palpation   Labs: Results for orders placed or performed during the hospital encounter of 10/27/23 (from the past 24 hours)  Urinalysis, Routine w reflex microscopic -Urine, Clean Catch     Status: Abnormal   Collection Time: 10/27/23  6:55 AM  Result Value Ref Range   Color, Urine YELLOW YELLOW   APPearance CLEAR CLEAR   Specific Gravity, Urine 1.011 1.005 - 1.030   pH 6.0 5.0 - 8.0   Glucose, UA NEGATIVE NEGATIVE mg/dL   Hgb urine dipstick NEGATIVE NEGATIVE   Bilirubin Urine NEGATIVE NEGATIVE   Ketones, ur NEGATIVE NEGATIVE mg/dL   Protein, ur NEGATIVE NEGATIVE mg/dL   Nitrite NEGATIVE NEGATIVE   Leukocytes,Ua TRACE (A) NEGATIVE   RBC / HPF 0-5 0 - 5 RBC/hpf   WBC, UA 0-5 0 - 5 WBC/hpf   Bacteria, UA RARE (A) NONE SEEN   Squamous Epithelial / HPF 0-5 0 - 5  /HPF   Mucus PRESENT    O/Positive/-- (06/02 1140)  Imaging:   MAU Course/MDM: Orders Placed This Encounter  Procedures   Urinalysis, Routine w reflex microscopic -Urine, Clean Catch   Discharge patient Discharge disposition: 01-Home or Self Care; Discharge patient date: 10/27/2023    Meds ordered this encounter  Medications   lactated ringers  bolus 1,000 mL   famotidine  (PEPCID ) IVPB 20 mg  premix   glycopyrrolate  (ROBINUL ) injection 0.2 mg   DISCONTD: metoCLOPramide  (REGLAN ) injection 10 mg   promethazine  (PHENERGAN ) 12.5 mg in sodium chloride  0.9 % 1,000 mL infusion     NST reviewed and appropriate for gestational age Cervix FT and effaced more than expected for G2P0 but unchanged in 3+ hours in MAU, so no evidence of active PTL 2000 ml of IV fluids given before pt able to leave urine sample, so despite results of UA, concerns for dehydration Stool palpable on cervical exam so possible constipation, but pt denies any concerns GC/Chlamydia and wet prep negative on 9/21 so not repeated today D/C home with PTL/return precautions Keep scheduled appt at [facility]  Assessment: 1. Threatened premature labor in third trimester   2. [redacted] weeks gestation of pregnancy   3. Abdominal pain affecting pregnancy     Plan: Discharge home Labor precautions and fetal kick counts  Follow-up Information     [facility] Follow up.   Specialty: Obstetrics and Gynecology Why: As needed for emergencies Contact information: [address] [place]  [phone]               Allergies as of 10/27/2023       Reactions   Antihistamines, Diphenhydramine-type Anaphylaxis   Blueberry Flavor [flavoring Agent] Anaphylaxis   Citrus Hives, Other (See Comments)   Mouth Burns        Medication List     TAKE these medications    docusate sodium  100 MG capsule Commonly known as: Colace Take 1 capsule (100 mg total) by mouth 2 (two)  times daily as needed for mild constipation.   famotidine  20 MG tablet Commonly known as: Pepcid  Take 1 tablet (20 mg total) by mouth 2 (two) times daily.   glycopyrrolate  1 MG tablet Commonly known as: Robinul  Take 1 tablet (1 mg total) by mouth 3 (three) times daily.   metoCLOPramide  10 MG tablet Commonly known as: REGLAN  Take 1 tablet (10 mg total) by mouth 3 (three) times daily with meals.   ondansetron  4 MG disintegrating tablet Commonly known as: ZOFRAN -ODT Take 1 tablet (4 mg total) by mouth every 8 (eight) hours as needed for nausea or vomiting.   pantoprazole  20 MG tablet Commonly known as: Protonix  Take 1 tablet (20 mg total) by mouth 2 (two) times daily.   Prenatal 28-0.8 MG Tabs Take 1 tablet by mouth daily.        Olam Boards Certified Nurse-Midwife 10/27/2023 7:42 AM

## 2023-10-27 NOTE — Discharge Instructions (Signed)
 Reasons to return to MAU at [facility]:  Since you are preterm, return to MAU if:  1.  Contractions are 10 minutes apart or less and they becoming more uncomfortable or painful over time 2.  You have a large gush of fluid, or a trickle of fluid that will not stop and you have to wear a pad 3.  You have bleeding that is bright red, heavier than spotting--like menstrual bleeding (spotting can be normal in early labor or after a check of your cervix) 4.  You do not feel the baby moving like he/she normally does

## 2023-10-27 NOTE — MAU Note (Signed)
 MAU Triage Note: Vicki Clayton is a 23 y.o. at [redacted]w[redacted]d here in MAU reporting: constant, stabbing abdominal pain that started around 2000 this evening. She also reports on-going nausea throughout her pregnancy and last took zofran  and reglan  at 1900. No active vomiting in triage. Denies VB or LOF. Reports +FM.  Patient complaint: stabbing cramps  Pain Score: 8  Pain Location: Abdomen     Onset of complaint: see above LMP: Patient's last menstrual period was 04/23/2023.  There were no vitals filed for this visit.  FHT:   Pt brought straight back to room Lab orders placed from triage: UA

## 2023-11-01 ENCOUNTER — Other Ambulatory Visit

## 2023-11-01 ENCOUNTER — Ambulatory Visit (INDEPENDENT_AMBULATORY_CARE_PROVIDER_SITE_OTHER): Admitting: Certified Nurse Midwife

## 2023-11-01 VITALS — BP 116/72 | HR 101 | Wt 175.0 lb

## 2023-11-01 DIAGNOSIS — Z3A27 27 weeks gestation of pregnancy: Secondary | ICD-10-CM | POA: Diagnosis not present

## 2023-11-01 DIAGNOSIS — Z23 Encounter for immunization: Secondary | ICD-10-CM | POA: Diagnosis not present

## 2023-11-01 DIAGNOSIS — K117 Disturbances of salivary secretion: Secondary | ICD-10-CM

## 2023-11-01 DIAGNOSIS — Z3480 Encounter for supervision of other normal pregnancy, unspecified trimester: Secondary | ICD-10-CM

## 2023-11-01 DIAGNOSIS — O479 False labor, unspecified: Secondary | ICD-10-CM | POA: Diagnosis not present

## 2023-11-01 DIAGNOSIS — O0993 Supervision of high risk pregnancy, unspecified, third trimester: Secondary | ICD-10-CM

## 2023-11-01 MED ORDER — GLYCOPYRROLATE 1 MG PO TABS: 1.0000 mg | ORAL_TABLET | Freq: Three times a day (TID) | ORAL | 1 refills | Status: DC

## 2023-11-01 NOTE — Progress Notes (Signed)
   PRENATAL VISIT NOTE  Subjective:  Vicki Clayton is a 23 y.o. G2P0010 at [redacted]w[redacted]d being seen today for ongoing prenatal care.  She is currently monitored for the following issues for this low-risk pregnancy and has Supervision of other normal pregnancy, antepartum; History of heart murmur in childhood; History of seizure; Hyperemesis gravidarum; Obesity affecting pregnancy, antepartum; and Spotting affecting pregnancy in third trimester on their problem list.  Patient reports lower abdominal cramping like contractions but patient reports that it is not as often as it has been previously. Patient also reports no improvement with Ptyalism, but denies taking anything for It. Endorses nausea medication with some relief. .  Contractions: Irritability. Vag. Bleeding: None.  Movement: Present. Denies leaking of fluid.   The following portions of the patient's history were reviewed and updated as appropriate: allergies, current medications, past family history, past medical history, past social history, past surgical history and problem list.   Objective:    Vitals:   11/01/23 0902  BP: 116/72  Pulse: (!) 101  Weight: 175 lb (79.4 kg)    Fetal Status:  Fetal Heart Rate (bpm): 147   Movement: Present    General: Alert, oriented and cooperative. Patient is in no acute distress.  Skin: Skin is warm and dry. No rash noted.   Cardiovascular: Normal heart rate noted  Respiratory: Normal respiratory effort, no problems with respiration noted  Abdomen: Soft, gravid, appropriate for gestational age.  Pain/Pressure: Present     Pelvic: Cervical exam deferred        Extremities: Normal range of motion.     Mental Status: Normal mood and affect. Normal behavior. Normal judgment and thought content.   Assessment and Plan:  Pregnancy: G2P0010 at [redacted]w[redacted]d 1. Supervision of high risk pregnancy in third trimester (Primary) - Patient overall doing well.  - Reports frequent and vigorous fetal movement    2. [redacted] weeks gestation of pregnancy - GTT today  - Fluid vaccine also given today.  - Tdap vaccine greater than or equal to 7yo IM  3. Braxton Hicks contractions - Reviewed Braxton hicks as a normal discomfort of pregnancy.  - Reviewed when to return to Hospital and sings and symptoms of labor   4. Encounter for immunization - Flu vaccine trivalent PF, 6mos and older(Flulaval,Afluria,Fluarix,Fluzone)  5. Ptyalism - Rx for Robinul  sent to outpatient pharmacy.  - Admin instructions provided at bedside.   Preterm labor symptoms and general obstetric precautions including but not limited to vaginal bleeding, contractions, leaking of fluid and fetal movement were reviewed in detail with the patient. Please refer to After Visit Summary for other counseling recommendations.   Return in about 2 weeks (around 11/15/2023) for LOB.  Future Appointments  Date Time Provider Department Center  11/15/2023  8:35 AM Emilio Delilah HERO, CNM [facility]  11/20/2023  9:00 AM [facility] PROVIDER 1 [facility]  11/20/2023  9:30 AM [facility] US5 [facility]  11/29/2023  8:35 AM Letha Renshaw, CNM [facility]    Kierre Deines Erven) Emilio, MSN, CNM  Center for [facility]  11/01/2023 9:25 AM

## 2023-11-02 LAB — GLUCOSE TOLERANCE, 2 HOURS W/ 1HR
Glucose, 1 hour: 190 mg/dL — ABNORMAL HIGH (ref 70–179)
Glucose, 2 hour: 72 mg/dL (ref 70–152)
Glucose, Fasting: 79 mg/dL (ref 70–91)

## 2023-11-02 LAB — CBC
Hematocrit: 30.6 % — ABNORMAL LOW (ref 34.0–46.6)
Hemoglobin: 9.9 g/dL — ABNORMAL LOW (ref 11.1–15.9)
MCH: 31.6 pg (ref 26.6–33.0)
MCHC: 32.4 g/dL (ref 31.5–35.7)
MCV: 98 fL — ABNORMAL HIGH (ref 79–97)
Platelets: 268 x10E3/uL (ref 150–450)
RBC: 3.13 x10E6/uL — ABNORMAL LOW (ref 3.77–5.28)
RDW: 13.4 % (ref 11.7–15.4)
WBC: 13.7 x10E3/uL — ABNORMAL HIGH (ref 3.4–10.8)

## 2023-11-02 LAB — RPR: RPR Ser Ql: NONREACTIVE

## 2023-11-02 LAB — HIV ANTIBODY (ROUTINE TESTING W REFLEX): HIV Screen 4th Generation wRfx: NONREACTIVE

## 2023-11-04 ENCOUNTER — Ambulatory Visit: Payer: Self-pay | Admitting: Certified Nurse Midwife

## 2023-11-04 DIAGNOSIS — O24419 Gestational diabetes mellitus in pregnancy, unspecified control: Secondary | ICD-10-CM

## 2023-11-06 DIAGNOSIS — O24410 Gestational diabetes mellitus in pregnancy, diet controlled: Secondary | ICD-10-CM | POA: Insufficient documentation

## 2023-11-06 DIAGNOSIS — O24919 Unspecified diabetes mellitus in pregnancy, unspecified trimester: Secondary | ICD-10-CM | POA: Insufficient documentation

## 2023-11-06 DIAGNOSIS — O24415 Gestational diabetes mellitus in pregnancy, controlled by oral hypoglycemic drugs: Secondary | ICD-10-CM | POA: Insufficient documentation

## 2023-11-12 ENCOUNTER — Inpatient Hospital Stay (HOSPITAL_COMMUNITY)
Admission: AD | Admit: 2023-11-12 | Discharge: 2023-11-12 | Disposition: A | Discharge status: Home / Self Care | Attending: Student | Admitting: Student

## 2023-11-12 ENCOUNTER — Encounter (HOSPITAL_COMMUNITY): Payer: Self-pay | Admitting: Obstetrics & Gynecology

## 2023-11-12 DIAGNOSIS — O479 False labor, unspecified: Secondary | ICD-10-CM

## 2023-11-12 DIAGNOSIS — Z0371 Encounter for suspected problem with amniotic cavity and membrane ruled out: Secondary | ICD-10-CM

## 2023-11-12 DIAGNOSIS — O219 Vomiting of pregnancy, unspecified: Secondary | ICD-10-CM | POA: Diagnosis not present

## 2023-11-12 DIAGNOSIS — O23593 Infection of other part of genital tract in pregnancy, third trimester: Secondary | ICD-10-CM | POA: Insufficient documentation

## 2023-11-12 DIAGNOSIS — O4703 False labor before 37 completed weeks of gestation, third trimester: Secondary | ICD-10-CM | POA: Insufficient documentation

## 2023-11-12 DIAGNOSIS — Z3A29 29 weeks gestation of pregnancy: Secondary | ICD-10-CM

## 2023-11-12 DIAGNOSIS — O98813 Other maternal infectious and parasitic diseases complicating pregnancy, third trimester: Secondary | ICD-10-CM | POA: Diagnosis not present

## 2023-11-12 DIAGNOSIS — B3731 Acute candidiasis of vulva and vagina: Secondary | ICD-10-CM | POA: Insufficient documentation

## 2023-11-12 DIAGNOSIS — N898 Other specified noninflammatory disorders of vagina: Secondary | ICD-10-CM | POA: Diagnosis present

## 2023-11-12 LAB — WET PREP, GENITAL
Sperm: NONE SEEN
Trich, Wet Prep: NONE SEEN
WBC, Wet Prep HPF POC: >=10 — AB (ref <10)
Yeast Wet Prep HPF POC: NONE SEEN

## 2023-11-12 LAB — POCT FERN TEST: POCT Fern Test: NEGATIVE

## 2023-11-12 MED ORDER — CLOTRIMAZOLE 1 % VA CREA: 1.0000 | TOPICAL_CREAM | Freq: Every day | VAGINAL | 2 refills | Status: AC

## 2023-11-12 MED ORDER — LACTATED RINGERS IV BOLUS
1000.0000 mL | Freq: Once | INTRAVENOUS | Status: AC
  Administered 2023-11-12: 1000 mL via INTRAVENOUS

## 2023-11-12 MED ORDER — ONDANSETRON HCL 4 MG/2ML IJ SOLN
4.0000 mg | Freq: Once | INTRAMUSCULAR | Status: AC
  Administered 2023-11-12: 4 mg via INTRAVENOUS
  Filled 2023-11-12: qty 2

## 2023-11-12 NOTE — MAU Provider Note (Signed)
 History     CSN: [id number]  Arrival date and time: 11/12/23 1437   Event Date/Time   First Provider Initiated Contact with Patient 11/12/23 1547      Chief Complaint  Patient presents with   Contractions   Rupture of Membranes   HPI Glema Takaki is a 23 y.o. G2P0010 at [redacted]w[redacted]d who presents for LOF, contractions, and n/v.  States had some increase in watery discharge today.  Also has vaginal irritation and itching and is concerned she has a yeast infection.  Has had issues with contractions. Current symptoms started last night.  Reports painful contractions every 15 minutes.  Has had persistent nausea and vomiting with the pregnancy.  Has not taken antiemetic today.  Has been able to eat today but has not had any water.  Denies vaginal bleeding.  Reports good fetal movement.  OB History     Gravida  2   Para  0   Term  0   Preterm  0   AB  1   Living  0      SAB  1   IAB  0   Ectopic  0   Multiple  0   Live Births  0           Past Medical History:  Diagnosis Date   Anemia    Appendicitis    Heart murmur of newborn    Psychogenic nonepileptic seizure    Vasovagal syncope     Past Surgical History:  Procedure Laterality Date   ELBOW CLOSED REDUCTION W/ PERCUANEOUS PINNING     ELBOW SURGERY     KNEE ARTHROSCOPY WITH ANTERIOR CRUCIATE LIGAMENT (ACL) REPAIR Left 05/30/2019   Procedure: KNEE ARTHROSCOPY WITH ANTERIOR CRUCIATE LIGAMENT (ACL) REPAIR WITH QUADRICEPS AUTOGRAFT;  Surgeon: Cristy Bonner DASEN, MD;  Location: [facility];  Service: Orthopedics;  Laterality: Left;    Family History  Problem Relation Age of Onset   Cancer Mother    Hypertension Mother    Cancer Father    Breast cancer Maternal Grandmother    Diabetes Maternal Grandmother    Breast cancer Paternal Grandmother    Heart disease Other    Diabetes Other    Hypertension Other    Cancer Other    Migraines Neg Hx    Seizures Neg Hx    Depression Neg Hx     Anxiety disorder Neg Hx    Bipolar disorder Neg Hx    Schizophrenia Neg Hx    ADD / ADHD Neg Hx    Autism Neg Hx     Social History   Tobacco Use   Smoking status: Never    Passive exposure: Never   Smokeless tobacco: Never  Vaping Use   Vaping status: Never Used  Substance Use Topics   Alcohol use: No   Drug use: No    Allergies:  Allergies  Allergen Reactions   Antihistamines, Diphenhydramine-Type Anaphylaxis   Blueberry Flavor [Flavoring Agent] Anaphylaxis   Citrus Hives and Other (See Comments)    Mouth Burns    Medications Prior to Admission  Medication Sig Dispense Refill Last Dose/Taking   famotidine  (PEPCID ) 20 MG tablet Take 1 tablet (20 mg total) by mouth 2 (two) times daily. 30 tablet 0 11/11/2023   glycopyrrolate  (ROBINUL ) 1 MG tablet Take 1 tablet (1 mg total) by mouth 3 (three) times daily. 270 tablet 1 11/11/2023   Prenatal 28-0.8 MG TABS Take 1 tablet by mouth daily. 30 tablet 12  11/11/2023   docusate sodium  (COLACE) 100 MG capsule Take 1 capsule (100 mg total) by mouth 2 (two) times daily as needed for mild constipation. (Patient not taking: Reported on 11/01/2023) 10 capsule 0    metoCLOPramide  (REGLAN ) 10 MG tablet Take 1 tablet (10 mg total) by mouth 3 (three) times daily with meals. (Patient not taking: Reported on 11/01/2023) 90 tablet 1    ondansetron  (ZOFRAN -ODT) 4 MG disintegrating tablet Take 1 tablet (4 mg total) by mouth every 8 (eight) hours as needed for nausea or vomiting. 12 tablet 0 Unknown   pantoprazole  (PROTONIX ) 20 MG tablet Take 1 tablet (20 mg total) by mouth 2 (two) times daily. (Patient not taking: Reported on 11/01/2023) 60 tablet 3     Review of Systems  All other systems reviewed and are negative.  Physical Exam   Blood pressure (!) 99/52, pulse (!) 112, temperature 98.4 F (36.9 C), temperature source Oral, resp. rate 18, last menstrual period 04/23/2023, SpO2 97%.  Physical Exam Vitals and nursing note reviewed. Exam  conducted with a chaperone present.  Constitutional:      General: She is not in acute distress.    Appearance: Normal appearance. She is well-developed. She is not ill-appearing.  HENT:     Head: Normocephalic and atraumatic.  Eyes:     General: No scleral icterus.       Right eye: No discharge.        Left eye: No discharge.     Conjunctiva/sclera: Conjunctivae normal.     Pupils: Pupils are equal, round, and reactive to light.  Pulmonary:     Effort: Pulmonary effort is normal. No respiratory distress.  Abdominal:     Tenderness: There is no abdominal tenderness.     Comments: gravid  Genitourinary:    Comments: SSE: Moderate amount of clumpy white discharge adherent to vaginal walls & cervix. Erythema of vaginal walls. No pooling of fluid or blood.  Skin:    General: Skin is warm and dry.  Neurological:     General: No focal deficit present.     Mental Status: She is alert.  Psychiatric:        Mood and Affect: Mood normal.        Behavior: Behavior normal.    Dilation: Fingertip Effacement (%): Thick Cervical Position: Posterior Station: -3 Exam by:: Rocky Satterfield NP  NST:  Baseline: 150 bpm, Variability: Good {> 6 bpm), Accelerations: Reactive, and Decelerations: Absent  MAU Course  Procedures Results for orders placed or performed during the hospital encounter of 11/12/23 (from the past 24 hours)  Wet prep, genital     Status: Abnormal   Collection Time: 11/12/23  3:31 PM  Result Value Ref Range   Yeast Wet Prep HPF POC NONE SEEN NONE SEEN   Trich, Wet Prep NONE SEEN NONE SEEN   Clue Cells Wet Prep HPF POC PRESENT (A) NONE SEEN   WBC, Wet Prep HPF POC >=10 (A) <10   Sperm NONE SEEN   Fern Test     Status: Normal   Collection Time: 11/12/23  4:00 PM  Result Value Ref Range   POCT Fern Test Negative = intact amniotic membranes     MDM   Assessment and Plan   1. Braxton Hicks contractions  - Toco quiet.  Cervix unchanged from previous MAU visit.   Symptoms improved with IV fluids.  Reviewed preterm labor precautions  2. Nausea and vomiting during pregnancy  -Given IV fluids and IV Zofran .  Able to eat after meds.  3. Encounter for suspected PROM, with rupture of membranes not found  -Sterile speculum exam performed.  No pooling.  Ferning negative -Wet prep and GC/CT collected.  Exam consistent with yeast  4. Vaginal yeast infection  -Wet prep positive for clue cells but exam indicates a vaginal yeast infection.  Prescribed clotrimazole   5. [redacted] weeks gestation of pregnancy      Rocky Satterfield 11/12/2023, 3:47 PM

## 2023-11-12 NOTE — MAU Note (Signed)
 Vicki Clayton is a 23 y.o. at [redacted]w[redacted]d here in MAU reporting: CTXs that started at 1200 and then felt like she was peeing on herself at 1300. Clear fluid that has continued to leak. Not wearing a pad currently. Reporting feeling cramping every 15 minutes. Denies any current vaginal bleeding. Reports +FM Denies any recent sexual intercourse. Patient denies any unusual vaginal discharge, vaginal odor, itching or pain. Patient denies any pain with urination or urinary frequency.  Reports nausea but no vomiting or diarrhea. Has not taken any medication for pain.  Pt had cervix checked a few weeks ago and was 1cm.      Onset of complaint: today Pain score: 9 Vitals:   11/12/23 1510  BP: (!) 99/52  Pulse: (!) 112  Resp: 18  Temp: 98.4 F (36.9 C)  SpO2: 97%     FHT:160 Lab orders placed from triage:  UA, fern

## 2023-11-13 LAB — CULTURE, OB URINE
Culture: >=100000 — AB
Special Requests: NORMAL

## 2023-11-13 LAB — GC/CHLAMYDIA PROBE AMP (~~LOC~~) NOT AT ARMC
Chlamydia: NEGATIVE
Comment: NEGATIVE
Comment: NORMAL
Neisseria Gonorrhea: NEGATIVE

## 2023-11-14 ENCOUNTER — Telehealth: Payer: Self-pay

## 2023-11-14 NOTE — Telephone Encounter (Signed)
 Following up on MAU urine cx results. See pt mychart message.

## 2023-11-15 ENCOUNTER — Ambulatory Visit: Admitting: Certified Nurse Midwife

## 2023-11-15 VITALS — BP 100/64 | HR 93 | Wt 175.0 lb

## 2023-11-15 DIAGNOSIS — O24419 Gestational diabetes mellitus in pregnancy, unspecified control: Secondary | ICD-10-CM

## 2023-11-15 DIAGNOSIS — O99013 Anemia complicating pregnancy, third trimester: Secondary | ICD-10-CM

## 2023-11-15 DIAGNOSIS — D649 Anemia, unspecified: Secondary | ICD-10-CM

## 2023-11-15 DIAGNOSIS — Z3A29 29 weeks gestation of pregnancy: Secondary | ICD-10-CM

## 2023-11-15 DIAGNOSIS — Z3480 Encounter for supervision of other normal pregnancy, unspecified trimester: Secondary | ICD-10-CM

## 2023-11-15 MED ORDER — ACCU-CHEK SOFTCLIX LANCETS MISC: 12 refills | Status: DC

## 2023-11-15 MED ORDER — ACCU-CHEK GUIDE W/DEVICE KIT: 1.0000 | PACK | Freq: Four times a day (QID) | 0 refills | Status: DC

## 2023-11-15 MED ORDER — ACCU-CHEK GUIDE TEST VI STRP: 1.0000 | ORAL_STRIP | Freq: Four times a day (QID) | 12 refills | Status: DC

## 2023-11-15 NOTE — Progress Notes (Signed)
   PRENATAL VISIT NOTE  Subjective:  Vicki Clayton is a 23 y.o. G2P0010 at [redacted]w[redacted]d being seen today for ongoing prenatal care.  She is currently monitored for the following issues for this low-risk pregnancy and has Supervision of other normal pregnancy, antepartum; History of heart murmur in childhood; History of seizure; Hyperemesis gravidarum; Obesity affecting pregnancy, antepartum; Spotting affecting pregnancy in third trimester; and DM (diabetes mellitus) in pregnancy on their problem list.  Patient reports fatigue and dizziness. Pt was recently started on Oral iron for anemia and patient denies taking.   Contractions: Irritability. Vag. Bleeding: None.  Movement: Present. Denies leaking of fluid.   The following portions of the patient's history were reviewed and updated as appropriate: allergies, current medications, past family history, past medical history, past social history, past surgical history and problem list.   Objective:    Vitals:   11/15/23 0842  BP: 100/64  Pulse: 93  Weight: 175 lb (79.4 kg)    Fetal Status:  Fetal Heart Rate (bpm): 150 Fundal Height: 28 cm Movement: Present    General: Alert, oriented and cooperative. Patient is in no acute distress.  Skin: Skin is warm and dry. No rash noted.   Cardiovascular: Normal heart rate noted  Respiratory: Normal respiratory effort, no problems with respiration noted  Abdomen: Soft, gravid, appropriate for gestational age.  Pain/Pressure: Absent     Pelvic: Cervical exam deferred        Extremities: Normal range of motion.  Edema: Trace  Mental Status: Normal mood and affect. Normal behavior. Normal judgment and thought content.   Assessment and Plan:  Pregnancy: G2P0010 at [redacted]w[redacted]d 1. Supervision of other normal pregnancy, antepartum (Primary) - Patient doing well.  - Reports frequent and vigorous fetal movement.   2. [redacted] weeks gestation of pregnancy - Reviewed 3rd trimester expectations.   3. Gestational  diabetes mellitus (GDM) in second trimester, gestational diabetes method of control unspecified - 2 hr GTT abnormal at the 1hr but all other values normal  - Patient convinced that she does not have GDM.  - Options of repeating testing, vs 1hr GTT, vs checking sugars at home x1-2 weeks and monitoring results. Through shared decision making patient and provider agreed upon checking x1 week and returning log.  - Log provided and supplies ordered.   4. Anemia, unspecified type - Recommended that patient start a OTC iron supplement.  - Discussed that symptoms like fatigue and dizziness can be associated with anemia in pregnancy.   Preterm labor symptoms and general obstetric precautions including but not limited to vaginal bleeding, contractions, leaking of fluid and fetal movement were reviewed in detail with the patient. Please refer to After Visit Summary for other counseling recommendations.   Return in about 2 weeks (around 11/29/2023) for LOB.  Future Appointments  Date Time Provider Department Center  11/20/2023  9:00 AM [facility] PROVIDER 1 [facility]  11/20/2023  9:30 AM [facility] US5 [facility]  11/29/2023  8:35 AM Letha Renshaw, CNM [facility]    Trashaun Streight Erven) Emilio, MSN, CNM  Center for [facility]  11/15/2023 12:50 PM

## 2023-11-15 NOTE — Progress Notes (Signed)
 ROB   Pt wants to discuss GTT results.

## 2023-11-20 ENCOUNTER — Ambulatory Visit (HOSPITAL_BASED_OUTPATIENT_CLINIC_OR_DEPARTMENT_OTHER)

## 2023-11-20 ENCOUNTER — Other Ambulatory Visit: Payer: Self-pay | Admitting: *Deleted

## 2023-11-20 ENCOUNTER — Other Ambulatory Visit: Payer: Self-pay | Admitting: Obstetrics

## 2023-11-20 ENCOUNTER — Ambulatory Visit: Attending: Obstetrics | Admitting: Obstetrics

## 2023-11-20 DIAGNOSIS — O365930 Maternal care for other known or suspected poor fetal growth, third trimester, not applicable or unspecified: Secondary | ICD-10-CM | POA: Insufficient documentation

## 2023-11-20 DIAGNOSIS — O99213 Obesity complicating pregnancy, third trimester: Secondary | ICD-10-CM | POA: Insufficient documentation

## 2023-11-20 DIAGNOSIS — Z3A30 30 weeks gestation of pregnancy: Secondary | ICD-10-CM

## 2023-11-20 DIAGNOSIS — O99210 Obesity complicating pregnancy, unspecified trimester: Secondary | ICD-10-CM

## 2023-11-20 DIAGNOSIS — O24410 Gestational diabetes mellitus in pregnancy, diet controlled: Secondary | ICD-10-CM

## 2023-11-20 DIAGNOSIS — O26893 Other specified pregnancy related conditions, third trimester: Secondary | ICD-10-CM | POA: Insufficient documentation

## 2023-11-20 DIAGNOSIS — Z362 Encounter for other antenatal screening follow-up: Secondary | ICD-10-CM | POA: Diagnosis not present

## 2023-11-20 DIAGNOSIS — O43199 Other malformation of placenta, unspecified trimester: Secondary | ICD-10-CM

## 2023-11-20 DIAGNOSIS — O43193 Other malformation of placenta, third trimester: Secondary | ICD-10-CM | POA: Diagnosis not present

## 2023-11-20 DIAGNOSIS — E669 Obesity, unspecified: Secondary | ICD-10-CM

## 2023-11-20 DIAGNOSIS — O365990 Maternal care for other known or suspected poor fetal growth, unspecified trimester, not applicable or unspecified: Secondary | ICD-10-CM

## 2023-11-20 NOTE — Progress Notes (Unsigned)
 MFM Consult Note  Vicki Clayton is currently at 30 weeks and 1 day.  She has been followed due to maternal obesity and a marginal placental cord insertion noted on her prior exam.  She was recently diagnosed with diet-controlled gestational diabetes.    She reports that her fingerstick values have been elevated.  Sonographic findings Single intrauterine pregnancy at 30w 1d.  Fetal cardiac activity:  Observed and appears normal. Presentation: Cephalic. Interval fetal anatomy appears normal. Fetal biometry shows the estimated fetal weight of 3 pounds which measures at the 15th percentile.  However, the fetal abdominal circumference measures at the 8th percentile indicating IUGR. Amniotic fluid volume: Within normal limits.  AFI 18.34 cm.  MVP: 5.97 cm. Placenta: Posterior Fundal. Fetal movements were noted throughout today's exam.  Doppler studies of the umbilical arteries showed a normal S/D ratio of 2.33 .  There were no signs of absent or reversed end-diastolic flow.    IUGR The patient was reassured that IUGR is a common finding.  Most cases of IUGR resulted in the delivery of a healthy infant at or close to term. Due to IUGR, we will continue to follow her with fetal testing and umbilical artery Doppler studies.   She understands that should IUGR continue to be noted later in her pregnancy, delivery will be recommended at between 37 to 38 weeks.  Gestational diabetes  She was advised to continue to monitor her fingersticks 4 times daily (fasting and 2 hours after each meal).   She was advised that our goals for her fingerstick values are fasting values of 90-95 or less and two-hour postprandial values of 120 or less.   Should the majority of her fingerstick results be above these values, she may have to be started on insulin or metformin to help her achieve better glycemic control.   Due to borderline IUGR and gestational diabetes, she will return in 2 weeks for an umbilical  artery Doppler study and BPP.    The patient stated that all of her questions were answered today.  A total of 20 minutes was spent counseling and coordinating the care for this patient.  Greater than 50% of the time was spent in direct face-to-face contact.

## 2023-11-22 ENCOUNTER — Encounter: Payer: Self-pay | Admitting: Certified Nurse Midwife

## 2023-11-29 ENCOUNTER — Ambulatory Visit: Admitting: Obstetrics and Gynecology

## 2023-11-29 VITALS — BP 127/77 | HR 105 | Wt 176.0 lb

## 2023-11-29 DIAGNOSIS — O365931 Maternal care for other known or suspected poor fetal growth, third trimester, fetus 1: Secondary | ICD-10-CM | POA: Diagnosis not present

## 2023-11-29 DIAGNOSIS — O24410 Gestational diabetes mellitus in pregnancy, diet controlled: Secondary | ICD-10-CM

## 2023-11-29 DIAGNOSIS — Z3A31 31 weeks gestation of pregnancy: Secondary | ICD-10-CM | POA: Diagnosis not present

## 2023-11-29 DIAGNOSIS — O0993 Supervision of high risk pregnancy, unspecified, third trimester: Secondary | ICD-10-CM | POA: Diagnosis not present

## 2023-11-29 DIAGNOSIS — O365930 Maternal care for other known or suspected poor fetal growth, third trimester, not applicable or unspecified: Secondary | ICD-10-CM | POA: Insufficient documentation

## 2023-11-29 NOTE — Progress Notes (Unsigned)
  HIGH-RISK PREGNANCY OFFICE VISIT Patient name: Vicki Clayton MRN [id number]  Date of birth: [birth year] Chief Complaint:   Routine Prenatal Visit  History of Present Illness:   Vicki Clayton is a 23 y.o. G74P0010 female at [redacted]w[redacted]d with an Estimated Date of Delivery: 01/28/24 being seen today for ongoing management of a high-risk pregnancy complicated by A1DM Today she reports vomiting. She wanted to reviewed latest U/S results; didn't get a good explanation of results. Contractions: Irritability. Vag. Bleeding: None.  Movement: Present. denies leaking of fluid.  Review of Systems:   Pertinent items are noted in HPI Denies abnormal vaginal discharge w/ itching/odor/irritation, headaches, visual changes, shortness of breath, chest pain, abdominal pain, severe nausea/vomiting, or problems with urination or bowel movements unless otherwise stated above. Pertinent History Reviewed:  Reviewed past medical,surgical, social, obstetrical and family history.  Reviewed problem list, medications and allergies. Physical Assessment:   Vitals:   11/29/23 0904  BP: 127/77  Pulse: (!) 105  Weight: 176 lb (79.8 kg)  Body mass index is 34.95 kg/m.           Physical Examination:   General appearance: alert, well appearing, and in no distress, oriented to person, place, and time, and overweight  Mental status: alert, oriented to person, place, and time, normal mood, behavior, speech, dress, motor activity, and thought processes  Skin: warm & dry   Extremities: Edema: None    Cardiovascular: normal heart rate noted  Respiratory: normal respiratory effort, no distress  Abdomen: gravid, soft, non-tender  Pelvic: Cervical exam deferred         Fetal Status: Fetal Heart Rate (bpm): 146 Fundal Height: 31 cm Movement: Present    Fetal Surveillance Testing today: none   No results found for this or any previous visit (from the past 24 hours).  Assessment & Plan:  1) High-risk pregnancy  G2P0010 at [redacted]w[redacted]d with an Estimated Date of Delivery: 01/28/24   2) Supervision of high risk pregnancy in third trimester (Primary) - Reviewed results of U/S with patient, FOB and FOB's parents  3) Diet controlled gestational diabetes mellitus (GDM) in third trimester - Review of Blood Sugar Levels: FBS range = 98-155 (with 1 value = 91) mg/dL  2 hr PP range = 896-814 - Patient only checked BS for 7 days; that was her understanding of what she was supposed to do - Explained that with A1GDM, BS need to be checked 4 times a day everyday - Target BS goals given: FBS 90-95 and 2hr PP 120 or less - Reviewed diet log. More than 90% of meals are not carb-modified. - Explained the importance of making   4) IUGR (intrauterine growth restriction) affecting care of mother, third trimester, fetus 1 - Repeat U/S scheduled on 12/11/2023  5) [redacted] weeks gestation of pregnancy    Meds: No orders of the defined types were placed in this encounter.   Labs/procedures today: none  Treatment Plan:  continue to check blood sugars  Reviewed: Preterm labor symptoms and general obstetric precautions including but not limited to vaginal bleeding, contractions, leaking of fluid and fetal movement were reviewed in detail with the patient.  All questions were answered. Has home bp cuff. Check bp weekly, let us  know if >140/90.   Follow-up: Return in about 2 weeks (around 12/13/2023) for Return OB visit -- with MD.  No orders of the defined types were placed in this encounter.  Anuradha Chabot MSN, CNM 11/29/2023 1:13 PM

## 2023-11-29 NOTE — Progress Notes (Unsigned)
 ROB present w/family.  Would like to discuss most recent U/S .

## 2023-11-29 NOTE — Patient Instructions (Addendum)
 Diabetes: Healthy Eating for Adults When you have diabetes, also called diabetes mellitus, it's important to have healthy eating habits. Your blood sugar (glucose) levels are greatly affected by what you eat and drink. You need to eat healthy foods in the right amounts, at about the same times each day. Doing this can help you: Manage your blood sugar. Lower your risk of heart disease. Improve your blood pressure. Reach or stay at a healthy weight. What can affect my meal plan? Every person with diabetes is different. And each person has different needs for a meal plan. Your health care provider may suggest that you work with an expert in healthy eating called a dietitian. They can help you make a meal plan that's best for you. How do carbohydrates affect me? Carbohydrates, also called carbs, affect your blood sugar level more than any other type of food. Eating carbs raises the amount of sugar in your blood. It's important to know how many carbs you can safely have in each meal. This is different for every person. Your dietitian can help you calculate how many carbs you should have at each meal and for each snack. How does alcohol affect me? Alcohol can cause a decrease in blood sugar (hypoglycemia), especially if you use insulin or take certain diabetes medicines by mouth. Hypoglycemia can be a life-threatening condition. Symptoms of hypoglycemia are similar to those of having too much alcohol. They include confusion, being sleepy, and feeling dizzy. Do not drink alcohol if: Your provider tells you not to drink. You're pregnant, may be pregnant, or plan to become pregnant. What are tips for following this plan? Reading food labels Start by checking the serving size on the Nutrition Facts label of packaged foods and drinks. The number of calories and the amount of carbs, fats, and other nutrients listed on the label are based on one serving of the item. Many items contain more than one serving per  package. Check the total grams (g) of carbs in one serving. Check the number of grams of saturated fats and trans fats in one serving. Choose foods that have a low amount or none of these fats. Check the number of milligrams (mg) of salt (sodium) in one serving. Most people should limit their total sodium intake to less than 2,300 mg per day. Always check the nutrition information of foods labeled as low-fat or nonfat. These foods may be higher in added sugar or refined carbs and should be avoided. Talk to your dietitian to identify your daily goals for nutrients listed on the label. Shopping Avoid buying canned, pre-made, or processed foods. These foods tend to be high in fat, sodium, and added sugar. Shop around the outside edge of the grocery store. This is where you'll most often find fresh fruits and vegetables, bulk grains, fresh meats, and fresh dairy products. Cooking Use low-heat cooking methods, such as baking, instead of high-heat methods like deep frying. Cook using healthy oils, such as olive, canola, or sunflower oil. Avoid cooking with butter, cream, or high-fat meats. Meal planning  Eat meals and snacks regularly. Try to eat them at the same times every day. Avoid going too long without eating. Eat foods that are high in fiber, such as fresh fruits, vegetables, beans, and whole grains. Eat 4-6 oz (112-168 g) of lean protein each day, such as lean meat, chicken, fish, eggs, or tofu. One ounce (oz) (28 g) of lean protein is equal to: 1 oz (28 g) of meat, chicken, or fish.  1 egg.  cup (62 g) of tofu. Eat some foods each day that contain healthy fats, such as avocado, nuts, seeds, and fish. What foods should I eat? Fruits Berries. Apples. Oranges. Peaches. Apricots. Plums. Grapes. Mangoes. Papayas. Pomegranates. Kiwi. Cherries. Vegetables Leafy greens, including lettuce, spinach, kale, chard, collard greens, mustard greens, and cabbage. Beets. Cauliflower. Broccoli.  Carrots. Green beans. Tomatoes. Peppers. Onions. Cucumbers. Brussels sprouts. Grains Whole grains, such as whole-wheat or whole-grain bread, crackers, tortillas, cereal, and pasta. Unsweetened oatmeal. Quinoa. Brown or wild rice. Meats and other proteins Seafood. Poultry without skin. Lean cuts of poultry and beef. Tofu. Nuts. Seeds. Dairy Low-fat or fat-free dairy products such as milk, yogurt, and cheese. The items listed above may not be all the foods and drinks you can have. Talk with a dietitian to learn more. What foods should I avoid? Fruits Fruits canned with syrup. Vegetables Canned vegetables. Frozen vegetables with butter or cream sauce. Grains Refined white flour and flour products such as bread, pasta, snack foods, and cereals. Avoid all processed foods. Meats and other proteins Fatty cuts of meat. Poultry with skin. Breaded or fried meats. Processed meat. Avoid saturated fats. Dairy Full-fat yogurt, cheese, or milk. Beverages Sweetened drinks, such as soda or iced tea. The items listed above may not be all the foods and drinks you should avoid. Talk with a dietitian to learn more. Where to find more information: To learn more, go to: Academy of Nutrition and Dietetics at deathprevention.it. Click Search and type diabetes. Find the link you need. Centers for Disease Control and Prevention at tonerpromos.no. Click Search and type diabetes. Find the link you need. American Diabetes Association: diabetes.org/food-nutrition [employer] of Diabetes and Digestive and Kidney Diseases: stagesync.si This information is not intended to replace advice given to you by your health care provider. Make sure you discuss any questions you have with your health care provider. Document Revised: 01/05/2023 Document Reviewed: 01/05/2023 Elsevier Patient Education  2025 Elsevier Inc.Fetal Growth Restriction: What to Know  Fetal growth restriction (FGR) is when a baby is not growing  normally during pregnancy. A baby with FGR is smaller than they should be and may weigh less than normal at birth. Babies with FGR are at higher risk for early delivery. They may also need more care than usual after birth. What are the causes? Problems with the placenta or umbilical cord. This problem causes the fetus to get less oxygen or nutrition. Not enough food for the mother during pregnancy. Not enough weight gain for the mother during pregnancy. Smoking, drinking alcohol, and taking drugs. Some medicines. Problems that a baby may get before they're born (congenital). Problems that are passed in families (genetic). Being pregnant with twins or more. Infection. What increases the risk? You're older than age 15 or younger than age 49. You have certain medical conditions, such as: High blood pressure. Preeclampsia. Diabetes. Heart or kidney disease. Anemia. You live at a very high altitude. You have a history of FGR, or have a genetic disorder. You have a family history of FGR, or genetic disorders. You had treatments to help you get pregnant. What are the signs or symptoms? FGR does not cause many symptoms. Your health care provider may suspect FGR if your belly is not as big as it should be for the stage of your pregnancy. How is this diagnosed? FGR is diagnosed with physical exams and prenatal exams. Your provider may: Measure your uterus. Measure your baby using ultrasound. Do a test to check  the fluid around the baby in the uterus. This will show any infection or problems that can develop before the baby is born. Do tests to check blood flow to the placenta and to the baby. How is this treated? Your provider will treat the cause of FGR. Your pregnancy will be closely watched. If your baby is not getting enough blood because of a problem with the placenta, your baby may need to be delivered early. Follow these instructions at home: Medicines Take your medicines only as  told. This includes vitamins and supplements. Ask your provider before you take or use any medicines, supplements, vitamins, eye drops, and creams. General instructions Eat a healthy diet. Work with your health care provider or a dietitian to make sure that: You are getting enough nutrients. You are gaining enough weight during your pregnancy. Rest as needed. Try to get at least 8 hours of sleep every night. Do not drink alcohol or use drugs. Do not smoke, vape, or use nicotine or tobacco. Keep all follow-up visits. Your provider needs to check your health and your baby's growth. Contact a health care provider if: Your baby is moving less than usual. You have cramps in your belly or have pain in your hips or lower back. You have a sudden, sharp pain in the belly. You have a gush or trickle of fluid from your vagina and you cannot stop it. You have signs of infection, including a fever. You have bleeding from your vagina. You have symptoms of high blood pressure or preeclampsia. These include: A headache that doesn't go away when you take medicine. Sudden or extreme swelling of your face, hands, legs, or feet. Vision problems, such as: You see spots. You have blurry vision. You're sensitive to light. Get help right away if: You faint, have a seizure, or cannot think clearly. You have chest pain or trouble breathing. You have any of these symptoms and you're unable to reach your provider: Symptoms of infection, including a fever. You have bleeding from your vagina. You have symptoms of high blood pressure or preeclampsia. These symptoms may be an emergency. Call 911 right away. Do not wait to see if the symptoms will go away. Do not drive yourself to the hospital. This information is not intended to replace advice given to you by your health care provider. Make sure you discuss any questions you have with your health care provider. Document Revised: 06/20/2022 Document Reviewed:  06/20/2022 Elsevier Patient Education  2024 [employer].

## 2023-11-30 ENCOUNTER — Encounter: Payer: Self-pay | Admitting: Obstetrics and Gynecology

## 2023-11-30 ENCOUNTER — Telehealth: Payer: Self-pay | Admitting: *Deleted

## 2023-11-30 NOTE — Telephone Encounter (Signed)
 No answer no voice mail

## 2023-12-11 ENCOUNTER — Other Ambulatory Visit: Payer: Self-pay | Admitting: *Deleted

## 2023-12-11 ENCOUNTER — Ambulatory Visit: Attending: Obstetrics

## 2023-12-11 ENCOUNTER — Ambulatory Visit (HOSPITAL_BASED_OUTPATIENT_CLINIC_OR_DEPARTMENT_OTHER): Admitting: Maternal & Fetal Medicine

## 2023-12-11 VITALS — BP 118/68 | HR 86

## 2023-12-11 DIAGNOSIS — Z87898 Personal history of other specified conditions: Secondary | ICD-10-CM | POA: Insufficient documentation

## 2023-12-11 DIAGNOSIS — O365990 Maternal care for other known or suspected poor fetal growth, unspecified trimester, not applicable or unspecified: Secondary | ICD-10-CM | POA: Insufficient documentation

## 2023-12-11 DIAGNOSIS — O365930 Maternal care for other known or suspected poor fetal growth, third trimester, not applicable or unspecified: Secondary | ICD-10-CM

## 2023-12-11 DIAGNOSIS — O24410 Gestational diabetes mellitus in pregnancy, diet controlled: Secondary | ICD-10-CM | POA: Diagnosis not present

## 2023-12-11 DIAGNOSIS — O99213 Obesity complicating pregnancy, third trimester: Secondary | ICD-10-CM

## 2023-12-11 DIAGNOSIS — O43193 Other malformation of placenta, third trimester: Secondary | ICD-10-CM | POA: Diagnosis not present

## 2023-12-11 DIAGNOSIS — Z3A33 33 weeks gestation of pregnancy: Secondary | ICD-10-CM | POA: Diagnosis not present

## 2023-12-11 DIAGNOSIS — O43199 Other malformation of placenta, unspecified trimester: Secondary | ICD-10-CM | POA: Insufficient documentation

## 2023-12-11 DIAGNOSIS — E669 Obesity, unspecified: Secondary | ICD-10-CM

## 2023-12-11 DIAGNOSIS — O99210 Obesity complicating pregnancy, unspecified trimester: Secondary | ICD-10-CM

## 2023-12-11 NOTE — Progress Notes (Signed)
 Patient information  Patient Name: Vicki Clayton  Patient MRN:   [id number]  Referring practice: MFM Referring Provider: [facility]  Problem List   Patient Active Problem List   Diagnosis Date Noted   Poor fetal growth affecting management of mother in third trimester (resolved) 11/29/2023   Gestational diabetes mellitus in pregnancy, diet controlled 11/06/2023   Spotting affecting pregnancy in third trimester 10/22/2023   Obesity affecting pregnancy, antepartum 09/25/2023   History of heart murmur in childhood 08/28/2023   History of seizure 08/28/2023   Hyperemesis gravidarum 08/28/2023   Supervision of other normal pregnancy, antepartum 07/03/2023   Maternal Fetal medicine Consult  Jada UNIQUE Sadler is a 23 y.o. G2P0010 at [redacted]w[redacted]d here for ultrasound and consultation. Audery UNIQUE Plantz is doing well today with no acute concerns. Today we focused on the following:   The patient is here today for an ultrasound due to fetal growth restriction that has not resolved on today's ultrasound.  She also has gestational diabetes that is not well-controlled with diet but she has not started medication yet.  There is also marginal umbilical cord insertion.  I encouraged the patient to the fetal growth is normal today and no further umbilical artery Dopplers are needed.  Her gestational diabetes is suboptimally controlled.  She reports the majority of her fasting blood sugars are over 95 and her 2 hours are over 120.  I discussed the importance of dietary discretion as well as moderate exercise to decrease the risk of her glucose levels and backing her pregnancy in a negative way.  She will attempt to minimize carbohydrates and sugars and increase her walking this next week and bring her glucose log to her OB provider.  If at least 50% or more of her blood sugars are not at goal then she should start insulin.  If she is not willing to start insulin then metformin is  the next best medication.  The patient had time to ask questions that were answered to her satisfaction.  She verbalized understanding and agrees to proceed with the plan below.  Recommendations -UA Dopplers are no longer needed due to the normal fetal growth -Continue weekly nonstress test due to suboptimal glucose control -The patient will attempt to minimize carbohydrates and sugars and increase her walking this next week and bring her glucose log to her OB provider.  If at least 50% or more of her blood sugars are not at goal then she should start insulin.  If she is not willing to start insulin then metformin is the next best medication - Growth ultrasound and BPP scheduled for 1 month from now.  Delivery timing will be determined at that visit.  Review of Systems: A review of systems was performed and was negative except per HPI   Vitals and Physical Exam    12/11/2023    7:13 AM 11/29/2023    9:04 AM 11/20/2023    9:21 AM  Vitals with BMI  Weight  176 lbs   Systolic 118 127 884  Diastolic 68 77 58  Pulse 86 105 87   Sitting comfortably on the sonogram table Nonlabored breathing Normal rate and rhythm Abdomen is nontender  Past pregnancies OB History  Gravida Para Term Preterm AB Living  2 0 0 0 1 0  SAB IAB Ectopic Multiple Live Births  1 0 0 0 0    # Outcome Date GA Lbr Len/2nd Weight Sex Type Anes PTL Lv  2 Current  1 SAB 04/23/23             I spent 30 minutes reviewing the patients chart, including labs and images as well as counseling the patient about her medical conditions. Greater than 50% of the time was spent in direct face-to-face patient counseling.  Delora Smaller  MFM, [facility]   12/11/2023  9:41 AM

## 2023-12-13 ENCOUNTER — Ambulatory Visit (INDEPENDENT_AMBULATORY_CARE_PROVIDER_SITE_OTHER): Admitting: Certified Nurse Midwife

## 2023-12-13 ENCOUNTER — Encounter: Payer: Self-pay | Admitting: Certified Nurse Midwife

## 2023-12-13 VITALS — BP 116/72 | HR 89 | Wt 177.2 lb

## 2023-12-13 DIAGNOSIS — Z3A33 33 weeks gestation of pregnancy: Secondary | ICD-10-CM | POA: Diagnosis not present

## 2023-12-13 DIAGNOSIS — O24410 Gestational diabetes mellitus in pregnancy, diet controlled: Secondary | ICD-10-CM

## 2023-12-13 DIAGNOSIS — O365930 Maternal care for other known or suspected poor fetal growth, third trimester, not applicable or unspecified: Secondary | ICD-10-CM

## 2023-12-13 DIAGNOSIS — O0993 Supervision of high risk pregnancy, unspecified, third trimester: Secondary | ICD-10-CM | POA: Diagnosis not present

## 2023-12-13 DIAGNOSIS — K649 Unspecified hemorrhoids: Secondary | ICD-10-CM

## 2023-12-13 NOTE — Progress Notes (Signed)
 PRENATAL VISIT NOTE  Subjective:  Vicki Clayton is a 23 y.o. G2P0010 at [redacted]w[redacted]d being seen today for ongoing prenatal care.  She is currently monitored for the following issues for this high-risk pregnancy and has Supervision of other normal pregnancy, antepartum; History of heart murmur in childhood; History of seizure; Hyperemesis gravidarum; Obesity affecting pregnancy, antepartum; Spotting affecting pregnancy in third trimester; Gestational diabetes mellitus in pregnancy, diet controlled; and Poor fetal growth affecting management of mother in third trimester (resolved) on their problem list.  Patient reports swelling on her butt hole..  Contractions: Regular. Vag. Bleeding: None.  Movement: Present. Denies leaking of fluid.   The following portions of the patient's history were reviewed and updated as appropriate: allergies, current medications, past family history, past medical history, past social history, past surgical history and problem list.   Objective:   Vitals:   12/13/23 0908  BP: 116/72  Pulse: 89  Weight: 177 lb 4 oz (80.4 kg)    Fetal Status:  Fetal Heart Rate (bpm): 145   Movement: Present    General: Alert, oriented and cooperative. Patient is in no acute distress.  Skin: Skin is warm and dry. No rash noted.   Cardiovascular: Normal heart rate noted  Respiratory: Normal respiratory effort, no problems with respiration noted  Abdomen: Soft, gravid, appropriate for gestational age.  Pain/Pressure: Absent     Pelvic: Cervical exam deferred        Extremities: Normal range of motion.     Mental Status: Normal mood and affect. Normal behavior. Normal judgment and thought content.      11/01/2023    9:32 AM 08/28/2023   11:15 AM 07/03/2023   11:46 AM  Depression screen PHQ 2/9  Decreased Interest 1 2 0  Down, Depressed, Hopeless 1 1 0  PHQ - 2 Score 2 3 0  Altered sleeping 1 2 1   Tired, decreased energy 0 1 1  Change in appetite 0 3 0  Feeling bad or  failure about yourself  0 0 0  Trouble concentrating 0 0 0  Moving slowly or fidgety/restless 0 0 0  Suicidal thoughts 0 0 0  PHQ-9 Score 3  9  2    Difficult doing work/chores Not difficult at all       Data saved with a previous flowsheet row definition        11/01/2023    9:32 AM 08/28/2023   11:15 AM 07/03/2023   11:46 AM 05/17/2021   11:00 AM  GAD 7 : Generalized Anxiety Score  Nervous, Anxious, on Edge 1 2 0 0  Control/stop worrying 1 1 0 0  Worry too much - different things 1 2 1 3   Trouble relaxing 1 1 1 1   Restless 0 0 0 3  Easily annoyed or irritable 1 0 0 3  Afraid - awful might happen 0 2 0 0  Total GAD 7 Score 5 8 2 10   Anxiety Difficulty Not difficult at all       Assessment and Plan:  Pregnancy: G2P0010 at [redacted]w[redacted]d 1. Supervision of high risk pregnancy in third trimester (Primary) - Patient doing well.  - Reports frequent and vigorous fetal movement.  - Amb Referral to Nutrition and Diabetic Education  2. [redacted] weeks gestation of pregnancy - Reviewed 3rd trimester expectations.   3. Diet controlled gestational diabetes mellitus (GDM) in third trimester - Patient initially did not believe she had GDM. After logging her glucose for 1 week proven GDM.  -  Patient has not been tracking CBG as directed. She has also not had the education class.  - Amb Referral to Nutrition and Diabetic Education - Education provided on how to check BG at home and recommended to log as directed.  - If glucose still elevated outside of recommended range following diabetes EDU plan to start on medication and patient and FOB made aware of plan on todays visit.   4. Poor fetal growth affecting management of mother in third trimester, single or unspecified fetus - Resolved .  5. Hemorrhoids, unspecified hemorrhoid type - After viewing an image, patient has a hemorrhoid.  - Reviewed comfort measures and OTC options to manage hemorrhoids in pregnancy.   Preterm labor symptoms and general  obstetric precautions including but not limited to vaginal bleeding, contractions, leaking of fluid and fetal movement were reviewed in detail with the patient. Please refer to After Visit Summary for other counseling recommendations.   Return in about 2 weeks (around 12/27/2023) for HROB with CNM or MD.  Future Appointments  Date Time Provider Department Center  12/18/2023  9:30 AM [facility] NURSE [facility]  12/18/2023  9:45 AM [facility] NST [facility]  12/25/2023  8:30 AM [facility] NURSE [facility]  12/25/2023  8:45 AM [facility] NST [facility]  12/26/2023 10:55 AM Constant, Peggy, MD [facility]  01/01/2024  9:30 AM [facility] NURSE [facility]  01/01/2024  9:45 AM [facility] NST [facility]  01/09/2024 11:15 AM Fredirick Glenys RAMAN, MD [facility]  01/10/2024  7:15 AM [facility] PROVIDER 1 [facility]  01/10/2024  7:30 AM [facility] US1 [facility]  01/23/2024  9:55 AM Fredirick Glenys RAMAN, MD [facility]   Bryssa Tones Erven) Emilio, MSN, CNM  Center for [facility]  12/13/2023 12:22 PM

## 2023-12-18 ENCOUNTER — Ambulatory Visit

## 2023-12-18 ENCOUNTER — Other Ambulatory Visit

## 2023-12-20 ENCOUNTER — Other Ambulatory Visit (HOSPITAL_COMMUNITY)
Admission: RE | Admit: 2023-12-20 | Discharge: 2023-12-20 | Disposition: A | Discharge status: Home / Self Care | Source: Ambulatory Visit | Attending: Certified Nurse Midwife | Admitting: Certified Nurse Midwife

## 2023-12-20 ENCOUNTER — Ambulatory Visit (INDEPENDENT_AMBULATORY_CARE_PROVIDER_SITE_OTHER): Admitting: Certified Nurse Midwife

## 2023-12-20 VITALS — BP 105/70 | HR 102 | Wt 177.1 lb

## 2023-12-20 DIAGNOSIS — N760 Acute vaginitis: Secondary | ICD-10-CM | POA: Insufficient documentation

## 2023-12-20 DIAGNOSIS — B3731 Acute candidiasis of vulva and vagina: Secondary | ICD-10-CM | POA: Diagnosis present

## 2023-12-20 DIAGNOSIS — Z3A35 35 weeks gestation of pregnancy: Secondary | ICD-10-CM

## 2023-12-20 DIAGNOSIS — B9689 Other specified bacterial agents as the cause of diseases classified elsewhere: Secondary | ICD-10-CM

## 2023-12-20 DIAGNOSIS — O24419 Gestational diabetes mellitus in pregnancy, unspecified control: Secondary | ICD-10-CM

## 2023-12-20 DIAGNOSIS — O4700 False labor before 37 completed weeks of gestation, unspecified trimester: Secondary | ICD-10-CM | POA: Insufficient documentation

## 2023-12-20 DIAGNOSIS — O0993 Supervision of high risk pregnancy, unspecified, third trimester: Secondary | ICD-10-CM

## 2023-12-20 DIAGNOSIS — O24410 Gestational diabetes mellitus in pregnancy, diet controlled: Secondary | ICD-10-CM | POA: Diagnosis not present

## 2023-12-20 MED ORDER — METFORMIN HCL 500 MG PO TABS: 500.0000 mg | ORAL_TABLET | Freq: Two times a day (BID) | ORAL | 5 refills | Status: DC

## 2023-12-20 MED ORDER — TERCONAZOLE 0.4 % VA CREA: 1.0000 | TOPICAL_CREAM | Freq: Every day | VAGINAL | 0 refills | Status: DC

## 2023-12-20 MED ORDER — METRONIDAZOLE 500 MG PO TABS: 500.0000 mg | ORAL_TABLET | Freq: Two times a day (BID) | ORAL | 0 refills | Status: DC

## 2023-12-20 MED ORDER — NYSTATIN-TRIAMCINOLONE 100000-0.1 UNIT/GM-% EX CREA: TOPICAL_CREAM | Freq: Two times a day (BID) | CUTANEOUS | Status: DC

## 2023-12-20 NOTE — Progress Notes (Signed)
 Feeling more regular contractions last couple days

## 2023-12-21 LAB — CERVICOVAGINAL ANCILLARY ONLY
Bacterial Vaginitis (gardnerella): POSITIVE — AB
Candida Glabrata: NEGATIVE
Candida Vaginitis: POSITIVE — AB
Chlamydia: NEGATIVE
Comment: NEGATIVE
Comment: NEGATIVE
Comment: NEGATIVE
Comment: NEGATIVE
Comment: NEGATIVE
Comment: NORMAL
Neisseria Gonorrhea: NEGATIVE
Trichomonas: NEGATIVE

## 2023-12-23 ENCOUNTER — Encounter (HOSPITAL_COMMUNITY): Payer: Self-pay | Admitting: Obstetrics & Gynecology

## 2023-12-23 ENCOUNTER — Other Ambulatory Visit: Payer: Self-pay

## 2023-12-23 ENCOUNTER — Observation Stay (HOSPITAL_COMMUNITY)
Admission: AD | Admit: 2023-12-23 | Discharge: 2023-12-24 | Disposition: A | Discharge status: Home / Self Care | Attending: Obstetrics & Gynecology | Admitting: Obstetrics & Gynecology

## 2023-12-23 DIAGNOSIS — Z3480 Encounter for supervision of other normal pregnancy, unspecified trimester: Secondary | ICD-10-CM

## 2023-12-23 DIAGNOSIS — O99820 Streptococcus B carrier state complicating pregnancy: Secondary | ICD-10-CM | POA: Insufficient documentation

## 2023-12-23 DIAGNOSIS — B951 Streptococcus, group B, as the cause of diseases classified elsewhere: Secondary | ICD-10-CM | POA: Diagnosis present

## 2023-12-23 DIAGNOSIS — Z7984 Long term (current) use of oral hypoglycemic drugs: Secondary | ICD-10-CM | POA: Insufficient documentation

## 2023-12-23 DIAGNOSIS — Z3A35 35 weeks gestation of pregnancy: Secondary | ICD-10-CM | POA: Insufficient documentation

## 2023-12-23 DIAGNOSIS — O24415 Gestational diabetes mellitus in pregnancy, controlled by oral hypoglycemic drugs: Secondary | ICD-10-CM | POA: Diagnosis present

## 2023-12-23 LAB — CULTURE, BETA STREP (GROUP B ONLY): Strep Gp B Culture: POSITIVE — AB

## 2023-12-23 NOTE — MAU Provider Note (Signed)
  History     CSN: [id number]  Arrival date and time: 12/23/23 2133 First Provider Initiated Contact with Patient 12/23/2023  9:52 PM  Chief Complaint  Patient presents with   Contractions    HPI Vicki Clayton is a 23 y.o. G2P0010 at [redacted]w[redacted]d, 01/28/2024, by Last Menstrual Period, who presents to the Maternity Assessment Unit for ***.  ROS Contractions: {yes/no:20286}  Fetal Movement: {yes/no:20286}  Vaginal bleeding: {yes/no:20286}  LOF: {yes/no:20286}   (+)  (-)    Past Medical History:  Diagnosis Date   Anemia    Appendicitis    Heart murmur of newborn    Psychogenic nonepileptic seizure    Vasovagal syncope    Past Surgical History:  Procedure Laterality Date   ELBOW CLOSED REDUCTION W/ PERCUANEOUS PINNING     ELBOW SURGERY     KNEE ARTHROSCOPY WITH ANTERIOR CRUCIATE LIGAMENT (ACL) REPAIR Left 05/30/2019   Procedure: KNEE ARTHROSCOPY WITH ANTERIOR CRUCIATE LIGAMENT (ACL) REPAIR WITH QUADRICEPS AUTOGRAFT;  Surgeon: Cristy Bonner DASEN, MD;  Location: [facility];  Service: Orthopedics;  Laterality: Left;   Allergies  Allergen Reactions   Antihistamines, Diphenhydramine-Type Anaphylaxis   Blueberry Flavor [Flavoring Agent] Anaphylaxis   Citrus Hives and Other (See Comments)    Mouth Burns    Physical Exam  LMP 04/23/2023   Gen: alert, no acute distress CV: regular rate*** and rhythm*** Resp: nonlabored Abd: nontender*** gravid*** Extremities: trace*** pedal edema  Cervical Exam Dilation: 2 Effacement (%): Thick Cervical Position: Posterior Station: -3 Presentation: Vertex Exam by:: Hur, DO  FHT Baseline: *** bpm Variability: {:31519} Accelerations: {:31520} Decelerations: {FHR DECEL PRESENT:31526} Uterine activity: {Uterine contractions:31516}  Bedside Ultrasound {Bedside US :29431} My interpretation: {MAU Bedside US  Findings:29432}  Labs O/Positive/-- (06/02 1140)   No results found for this or any previous visit (from  the past 24 hours).  Imaging US  OB Comp Less 14 Wks with OB Transvaginal ***   Assessment and Plan  MDM Vicki Clayton is a 23 y.o. G2P0010 at [redacted]w[redacted]d, 01/28/2024, by Last Menstrual Period, who presents to the MAU for ***. Ddx: {FJLIIK:67536} ***.  No orders of the defined types were placed in this encounter.  No orders of the defined types were placed in this encounter.   Pt feeling *** improved after ***   There are no diagnoses linked to this encounter.  Results pending at the time of DC: *** Dispo: DC home in stable condition with return precautions discussed and included in AVS.    Barabara Maier, DO FM-OB Fellow Center for [facility]

## 2023-12-23 NOTE — MAU Note (Signed)
 Vicki Clayton is a 22 y.o., G2P0010 at [redacted]w[redacted]d here in MAU reporting: contractions that are 3 minutes apart starting around 6pm today. Rating pain in her lower abdomen and lower back a 9/10 with contractions. Positive for some bloody show and mucus-like discharge. Currently being treated for BV with Flagyl  and yeast with triamcinolone  cream. Denies LOF. Positive FM. Pregnancy complications include delayed entry to care & GDM that was previously diet-controlled, but recently started on metformin . Patient states she has been in preterm labor since second trimester; no inpatient stays or medications. Last cervical exam 11/19 was 2cm.  LMP: 04/23/23 Onset of complaint: 6pm on 12/23/23 Pain score: 9/10 Vitals:   12/23/23 2202  BP: 125/74  Pulse: (!) 119  Resp: 18  Temp: 98.5 F (36.9 C)     FHT: 160bpm

## 2023-12-24 ENCOUNTER — Encounter (HOSPITAL_COMMUNITY): Payer: Self-pay

## 2023-12-24 ENCOUNTER — Ambulatory Visit: Payer: Self-pay | Admitting: Certified Nurse Midwife

## 2023-12-24 ENCOUNTER — Encounter (HOSPITAL_COMMUNITY): Payer: Self-pay | Admitting: Obstetrics & Gynecology

## 2023-12-24 DIAGNOSIS — B951 Streptococcus, group B, as the cause of diseases classified elsewhere: Secondary | ICD-10-CM | POA: Diagnosis present

## 2023-12-24 DIAGNOSIS — O99820 Streptococcus B carrier state complicating pregnancy: Secondary | ICD-10-CM | POA: Diagnosis not present

## 2023-12-24 DIAGNOSIS — Z7984 Long term (current) use of oral hypoglycemic drugs: Secondary | ICD-10-CM | POA: Diagnosis not present

## 2023-12-24 DIAGNOSIS — O24415 Gestational diabetes mellitus in pregnancy, controlled by oral hypoglycemic drugs: Secondary | ICD-10-CM | POA: Diagnosis not present

## 2023-12-24 DIAGNOSIS — Z3A35 35 weeks gestation of pregnancy: Secondary | ICD-10-CM | POA: Diagnosis not present

## 2023-12-24 LAB — SYPHILIS: RPR W/REFLEX TO RPR TITER AND TREPONEMAL ANTIBODIES, TRADITIONAL SCREENING AND DIAGNOSIS ALGORITHM: RPR Ser Ql: NONREACTIVE

## 2023-12-24 LAB — GLUCOSE, CAPILLARY
Glucose-Capillary: 104 mg/dL — ABNORMAL HIGH (ref 70–99)
Glucose-Capillary: 109 mg/dL — ABNORMAL HIGH (ref 70–99)

## 2023-12-24 LAB — TYPE AND SCREEN
ABO/RH(D): O POS
Antibody Screen: NEGATIVE

## 2023-12-24 LAB — CBC
HCT: 31.6 % — ABNORMAL LOW (ref 36.0–46.0)
Hemoglobin: 10.6 g/dL — ABNORMAL LOW (ref 12.0–15.0)
MCH: 30.5 pg (ref 26.0–34.0)
MCHC: 33.5 g/dL (ref 30.0–36.0)
MCV: 91.1 fL (ref 80.0–100.0)
Platelets: 329 K/uL (ref 150–400)
RBC: 3.47 MIL/uL — ABNORMAL LOW (ref 3.87–5.11)
RDW: 13.2 % (ref 11.5–15.5)
WBC: 11.9 K/uL — ABNORMAL HIGH (ref 4.0–10.5)
nRBC: 0.3 % — ABNORMAL HIGH (ref 0.0–0.2)

## 2023-12-24 MED ORDER — LACTATED RINGERS IV SOLN: 500.0000 mL | INTRAVENOUS | Status: DC | PRN

## 2023-12-24 MED ORDER — LACTATED RINGERS IV SOLN: INTRAVENOUS | Status: DC

## 2023-12-24 MED ORDER — OXYTOCIN BOLUS FROM INFUSION: 333.0000 mL | Freq: Once | INTRAVENOUS | Status: DC

## 2023-12-24 MED ORDER — PENICILLIN G POT IN DEXTROSE 60000 UNIT/ML IV SOLN
3.0000 10*6.[IU] | INTRAVENOUS | Status: DC
  Administered 2023-12-24: 3 10*6.[IU] via INTRAVENOUS
  Filled 2023-12-24: qty 50

## 2023-12-24 MED ORDER — OXYTOCIN-SODIUM CHLORIDE 30-0.9 UT/500ML-% IV SOLN: 2.5000 [IU]/h | INTRAVENOUS | Status: DC

## 2023-12-24 MED ORDER — SODIUM CHLORIDE 0.9 % IV SOLN
5.0000 10*6.[IU] | Freq: Once | INTRAVENOUS | Status: AC
  Administered 2023-12-24: 5 10*6.[IU] via INTRAVENOUS
  Filled 2023-12-24: qty 5

## 2023-12-24 MED ORDER — ONDANSETRON HCL 4 MG/2ML IJ SOLN
4.0000 mg | Freq: Four times a day (QID) | INTRAMUSCULAR | Status: DC | PRN
Start: 2023-12-24 — End: 2023-12-24
  Administered 2023-12-24: 4 mg via INTRAVENOUS

## 2023-12-24 MED ORDER — ONDANSETRON HCL 4 MG/2ML IJ SOLN
INTRAMUSCULAR | Status: AC
  Filled 2023-12-24: qty 2

## 2023-12-24 MED ORDER — LIDOCAINE HCL (PF) 1 % IJ SOLN: 30.0000 mL | INTRAMUSCULAR | Status: DC | PRN

## 2023-12-24 MED ORDER — ACETAMINOPHEN 325 MG PO TABS: 650.0000 mg | ORAL_TABLET | ORAL | Status: DC | PRN

## 2023-12-24 NOTE — Progress Notes (Signed)
 PRENATAL VISIT NOTE  Subjective:  Vicki Clayton is a 23 y.o. G2P0010 at [redacted]w[redacted]d being seen today for ongoing prenatal care.  She is currently monitored for the following issues for this high-risk pregnancy and has Supervision of other normal pregnancy, antepartum; History of heart murmur in childhood; History of seizure; Hyperemesis gravidarum; Obesity affecting pregnancy, antepartum; Spotting affecting pregnancy in third trimester; Gestational diabetes mellitus (GDM) controlled on oral hypoglycemic drug; Poor fetal growth affecting management of mother in third trimester (resolved); Preterm labor in third trimester; Group beta Strep positive; and Labor and delivery, indication for care on their problem list.  Patient reports contractions becoming more frequent and intense. .  Contractions: Irregular. Vag. Bleeding: None.  Movement: Present. Denies leaking of fluid.   The following portions of the patient's history were reviewed and updated as appropriate: allergies, current medications, past family history, past medical history, past social history, past surgical history and problem list.   Objective:   Vitals:   12/20/23 0954  BP: 105/70  Pulse: (!) 102  Weight: 177 lb 2 oz (80.3 kg)    Fetal Status:  Fetal Heart Rate (bpm): NST   Movement: Present    General: Alert, oriented and cooperative. Patient is in no acute distress.  Skin: Skin is warm and dry. No rash noted.   Cardiovascular: Normal heart rate noted  Respiratory: Normal respiratory effort, no problems with respiration noted  Abdomen: Soft, gravid, appropriate for gestational age.  Pain/Pressure: Present     Pelvic: Cervical exam performed in the presence of a chaperone Dilation: 2 Effacement (%): Thick Station: [employer] odor also noted on examSABRA KYM Buckles RN   Extremities: Normal range of motion.     Mental Status: Normal mood and affect. Normal behavior. Normal judgment and thought content.      11/01/2023     9:32 AM 08/28/2023   11:15 AM 07/03/2023   11:46 AM  Depression screen PHQ 2/9  Decreased Interest 1 2 0  Down, Depressed, Hopeless 1 1 0  PHQ - 2 Score 2 3 0  Altered sleeping 1 2 1   Tired, decreased energy 0 1 1  Change in appetite 0 3 0  Feeling bad or failure about yourself  0 0 0  Trouble concentrating 0 0 0  Moving slowly or fidgety/restless 0 0 0  Suicidal thoughts 0 0 0  PHQ-9 Score 3  9  2    Difficult doing work/chores Not difficult at all       Data saved with a previous flowsheet row definition        11/01/2023    9:32 AM 08/28/2023   11:15 AM 07/03/2023   11:46 AM 05/17/2021   11:00 AM  GAD 7 : Generalized Anxiety Score  Nervous, Anxious, on Edge 1 2 0 0  Control/stop worrying 1 1 0 0  Worry too much - different things 1 2 1 3   Trouble relaxing 1 1 1 1   Restless 0 0 0 3  Easily annoyed or irritable 1 0 0 3  Afraid - awful might happen 0 2 0 0  Total GAD 7 Score 5 8 2 10   Anxiety Difficulty Not difficult at all       Assessment and Plan:  Pregnancy: G2P0010 at [redacted]w[redacted]d 1. Supervision of high risk pregnancy in third trimester (Primary) -Patient presented for NST. But was having contractions.  - Fetal nonstress test Reactive and Reassuring   2. Diet controlled gestational diabetes mellitus (GDM) in third trimester -  Patient admits not checking her CBGs and missed diet education class.  - Fetal nonstress test  3. GDM, class A2 - Given patient not checking BS and has not had the class, going to start on Metformin  at Bedtime.   4. Vaginal yeast infection - On exam today copious amount of what appears to be yeast.  - Rx for Terazol sent to outpatient pharmacy.  - Cervicovaginal ancillary only( [place])  5. Preterm uterine contractions - Patient denies water intake. Recommended to increase water and rest when able.  - Cervicovaginal ancillary only( [place]) - Culture, beta strep (group b only)  6. BV (bacterial vaginosis) - Rx for Metronidazole  sent  to outpatient pharmacy.  - Cervicovaginal ancillary only( [place])  Preterm labor symptoms and general obstetric precautions including but not limited to vaginal bleeding, contractions, leaking of fluid and fetal movement were reviewed in detail with the patient. Please refer to After Visit Summary for other counseling recommendations.   Return in about 2 weeks (around 01/03/2024) for LOB.  Future Appointments  Date Time Provider Department Center  12/26/2023 10:55 AM Constant, Winton, MD [facility]  01/09/2024 11:15 AM Fredirick Glenys RAMAN, MD [facility]  01/10/2024  7:15 AM [facility] PROVIDER 1 [facility]  01/10/2024  7:30 AM [facility] US1 [facility]  01/23/2024  9:55 AM Fredirick Glenys RAMAN, MD [facility]    Leonard Hendler Erven) Emilio, MSN, CNM  Center for [facility]  12/24/2023 10:28 PM

## 2023-12-24 NOTE — Discharge Summary (Signed)
 Discharge Summary     Patient Name: Vicki Clayton DOB: 03/19/2000 MRN: [id number]  Date of admission: 12/23/2023 Delivery date:  Delivering provider:   Date of discharge: 12/24/2023  Admitting diagnosis: Labor and delivery, indication for care [O75.9] Intrauterine pregnancy: [redacted]w[redacted]d     Secondary diagnosis:  Principal Problem:   Labor and delivery, indication for care Active Problems:   Supervision of other normal pregnancy, antepartum   Gestational diabetes mellitus (GDM) controlled on oral hypoglycemic drug   Preterm labor in third trimester   Group beta Strep positive   Discharge diagnosis: False labor                                                Hospital course: Patient initially admitted on the morning of 11/23 for regular contractions and positive cervical change. She was 2/thick around 2148 on 12/23/23 > 3/50 at 2256 and then 3.5/80/-2. Contractions spaced out and became irregular. Patient grew more comfortable and cervix remained unchanged after monitoring for 12 hours. Patient felt comfortable discharging home.   Strict preterm labor and return precautions provided.   Stable for discharge home.   Magnesium Sulfate received: No BMZ received: No Rhophylac:N/A Transfusion:No  Immunizations received: Immunization History  Administered Date(s) Administered   DTaP 07/05/2002, 09/04/2002, 11/11/2002, 06/06/2006   HIB (PRP-OMP) 07/05/2002, 09/04/2002, 11/11/2002   Hepatitis B 2002/06/16, 06/04/2002, 01/04/2003   IPV 07/05/2002, 09/04/2002, 07/18/2006   Influenza, Seasonal, Injecte, Preservative Fre 11/01/2023   MMR 06/06/2006, 07/18/2006   Pneumococcal Conjugate-13 07/05/2002, 09/04/2002, 11/11/2002   Tdap 11/01/2023   Varicella 06/06/2006, 12/22/2009    Physical exam  Vitals:   12/24/23 0800 12/24/23 0901 12/24/23 0930 12/24/23 0933  BP:  112/66 (!) 96/49 (!) 96/46  Pulse:  99 84 84  Resp:  16 16   Temp: 97.7 F (36.5 C)     TempSrc: Axillary      Weight:      Height:       General: alert, cooperative, and no distress  Labs: Lab Results  Component Value Date   WBC 11.9 (H) 12/24/2023   HGB 10.6 (L) 12/24/2023   HCT 31.6 (L) 12/24/2023   MCV 91.1 12/24/2023   PLT 329 12/24/2023      Latest Ref Rng & Units 08/28/2023   11:56 AM  CMP  Glucose 70 - 99 mg/dL 81   BUN 6 - 20 mg/dL 5   Creatinine 9.55 - 8.99 mg/dL 9.46   Sodium 864 - 854 mmol/L 136   Potassium 3.5 - 5.1 mmol/L 3.7   Chloride 98 - 111 mmol/L 106   CO2 22 - 32 mmol/L 20   Calcium 8.9 - 10.3 mg/dL 9.0   Total Protein 6.5 - 8.1 g/dL 6.8   Total Bilirubin 0.0 - 1.2 mg/dL 0.5   Alkaline Phos 38 - 126 U/L 78   AST 15 - 41 U/L 31   ALT 0 - 44 U/L 41    Edinburgh Score:     No data to display         No data recorded  After visit meds:  Allergies as of 12/24/2023       Reactions   Antihistamines, Diphenhydramine-type Anaphylaxis   Blueberry Flavor [flavoring Agent] Anaphylaxis   Citrus Hives, Other (See Comments)   Mouth Burns        Medication  List     STOP taking these medications    docusate sodium  100 MG capsule Commonly known as: Colace   glycopyrrolate  1 MG tablet Commonly known as: Robinul    metoCLOPramide  10 MG tablet Commonly known as: REGLAN    ondansetron  4 MG disintegrating tablet Commonly known as: ZOFRAN -ODT   pantoprazole  20 MG tablet Commonly known as: Protonix        TAKE these medications    Accu-Chek Guide Test test strip Generic drug: glucose blood 1 each by Other route 4 (four) times daily. Use as instructed   Accu-Chek Guide w/Device Kit 1 Device by Does not apply route 4 (four) times daily.   Accu-Chek Softclix Lancets lancets Use as instructed   famotidine  20 MG tablet Commonly known as: Pepcid  Take 1 tablet (20 mg total) by mouth 2 (two) times daily.   metFORMIN  500 MG tablet Commonly known as: GLUCOPHAGE  Take 1 tablet (500 mg total) by mouth 2 (two) times daily with a meal.    metroNIDAZOLE  500 MG tablet Commonly known as: FLAGYL  Take 1 tablet (500 mg total) by mouth 2 (two) times daily.   Prenatal 28-0.8 MG Tabs Take 1 tablet by mouth daily.   terconazole  0.4 % vaginal cream Commonly known as: TERAZOL 7  Place 1 applicator vaginally at bedtime. Use for seven days        Future Appointments: Future Appointments  Date Time Provider Department Center  12/26/2023 10:55 AM Constant, Winton, MD [facility]  01/09/2024 11:15 AM Fredirick Glenys RAMAN, MD [facility]  01/10/2024  7:15 AM [facility] PROVIDER 1 [facility]  01/10/2024  7:30 AM [facility] US1 [facility]  01/23/2024  9:55 AM Fredirick Glenys RAMAN, MD [facility]    12/24/2023 Dealva Lafoy LITTIE Angles, MD

## 2023-12-24 NOTE — H&P (Addendum)
 Obstetric History and Physical  Vicki Clayton is a 23 y.o. G2P0010 with IUP at [redacted]w[redacted]d presenting for preterm labor. Patient states she has been having  regular, every 3 minutes contractions that started around 1800 on 12/23/23, had some bloody show, intact membranes, with active fetal movement.  Patient was evaluated on 12/20/23 and her cervix was FT, then she was 2/thick around 2148 on 12/23/23 > 3/50 at 2256 and then 4/80/-2 recently.  She is very uncomfortable.  Prenatal Course Source of Care: [facility]  with onset of care at 18 weeks Pregnancy complications or risks: Patient Active Problem List   Diagnosis Date Noted   Preterm labor in third trimester 12/24/2023   Group beta Strep positive 12/24/2023   Poor fetal growth affecting management of mother in third trimester (resolved) 11/29/2023   Gestational diabetes mellitus (GDM) controlled on oral hypoglycemic drug 11/06/2023   Spotting affecting pregnancy in third trimester 10/22/2023   Obesity affecting pregnancy, antepartum 09/25/2023   History of heart murmur in childhood 08/28/2023   History of seizure 08/28/2023   Hyperemesis gravidarum 08/28/2023   Supervision of other normal pregnancy, antepartum 07/03/2023   She plans to breastfeed She desires Nexplanon for postpartum contraception.   Prenatal labs and studies: ABO, Rh: O/Positive/-- (06/02 1140) Antibody: Negative (06/02 1140) Rubella: 1.65 (06/02 1140) RPR: Non Reactive (10/01 0853)  HBsAg: Negative (06/02 1140)  HIV: Non Reactive (10/01 0853)  HAD:Endpupcz/-- (11/19 1300) 2 hr Glucola  [id number] Genetic screening normal Anatomy US  normal   Past Medical History:  Diagnosis Date   Anemia    Appendicitis    Heart murmur of newborn    Psychogenic nonepileptic seizure    Vasovagal syncope     Past Surgical History:  Procedure Laterality Date   ELBOW CLOSED REDUCTION W/ PERCUANEOUS PINNING     ELBOW SURGERY     KNEE ARTHROSCOPY WITH ANTERIOR  CRUCIATE LIGAMENT (ACL) REPAIR Left 05/30/2019   Procedure: KNEE ARTHROSCOPY WITH ANTERIOR CRUCIATE LIGAMENT (ACL) REPAIR WITH QUADRICEPS AUTOGRAFT;  Surgeon: Cristy Bonner DASEN, MD;  Location: [facility];  Service: Orthopedics;  Laterality: Left;    OB History  Gravida Para Term Preterm AB Living  2 0 0 0 1 0  SAB IAB Ectopic Multiple Live Births  1 0 0 0 0    # Outcome Date GA Lbr Len/2nd Weight Sex Type Anes PTL Lv  2 Current           1 SAB 04/23/23            Social History   Socioeconomic History   Marital status: Single    Spouse name: Not on file   Number of children: Not on file   Years of education: Not on file   Highest education level: Not on file  Occupational History   Not on file  Tobacco Use   Smoking status: Never    Passive exposure: Never   Smokeless tobacco: Never  Vaping Use   Vaping status: Never Used  Substance and Sexual Activity   Alcohol use: Not Currently   Drug use: No   Sexual activity: Yes    Birth control/protection: None  Other Topics Concern   Not on file  Social History Narrative   Vicki Clayton is in the 12th grade at [employer]; she struggles in school. She lives with parents. She enjoys dancing and step.    Social Drivers of Corporate Investment Banker Strain: Not on file  Food Insecurity: No Food  Insecurity (12/23/2023)   Hunger Vital Sign    Worried About Running Out of Food in the Last Year: Never true    Ran Out of Food in the Last Year: Never true  Transportation Needs: No Transportation Needs (12/23/2023)   PRAPARE - Administrator, Civil Service (Medical): No    Lack of Transportation (Non-Medical): No  Physical Activity: Not on file  Stress: Not on file  Social Connections: Not on file    Family History  Problem Relation Age of Onset   Cancer Mother    Hypertension Mother    Cancer Father    Breast cancer Maternal Grandmother    Diabetes Maternal Grandmother    Breast cancer Paternal Grandmother     Heart disease Other    Diabetes Other    Hypertension Other    Cancer Other    Migraines Neg Hx    Seizures Neg Hx    Depression Neg Hx    Anxiety disorder Neg Hx    Bipolar disorder Neg Hx    Schizophrenia Neg Hx    ADD / ADHD Neg Hx    Autism Neg Hx     Facility-Administered Medications Prior to Admission  Medication Dose Route Frequency Provider Last Rate Last Admin   nystatin -triamcinolone  (MYCOLOG II) cream   Topical BID        Medications Prior to Admission  Medication Sig Dispense Refill Last Dose/Taking   famotidine  (PEPCID ) 20 MG tablet Take 1 tablet (20 mg total) by mouth 2 (two) times daily. 30 tablet 0 Past Month   metFORMIN  (GLUCOPHAGE ) 500 MG tablet Take 1 tablet (500 mg total) by mouth 2 (two) times daily with a meal. 60 tablet 5 12/22/2023   metroNIDAZOLE  (FLAGYL ) 500 MG tablet Take 1 tablet (500 mg total) by mouth 2 (two) times daily. 14 tablet 0 12/23/2023   Prenatal 28-0.8 MG TABS Take 1 tablet by mouth daily. 30 tablet 12 12/23/2023   terconazole  (TERAZOL 7 ) 0.4 % vaginal cream Place 1 applicator vaginally at bedtime. Use for seven days 45 g 0 12/23/2023   Accu-Chek Softclix Lancets lancets Use as instructed 100 each 12    Blood Glucose Monitoring Suppl (ACCU-CHEK GUIDE) w/Device KIT 1 Device by Does not apply route 4 (four) times daily. 1 kit 0    docusate sodium  (COLACE) 100 MG capsule Take 1 capsule (100 mg total) by mouth 2 (two) times daily as needed for mild constipation. (Patient not taking: Reported on 11/15/2023) 10 capsule 0    glucose blood (ACCU-CHEK GUIDE TEST) test strip 1 each by Other route 4 (four) times daily. Use as instructed 100 each 12    glycopyrrolate  (ROBINUL ) 1 MG tablet Take 1 tablet (1 mg total) by mouth 3 (three) times daily. (Patient not taking: Reported on 12/23/2023) 270 tablet 1 Not Taking   metoCLOPramide  (REGLAN ) 10 MG tablet Take 1 tablet (10 mg total) by mouth 3 (three) times daily with meals. (Patient not taking: Reported on  12/23/2023) 90 tablet 1 Not Taking   ondansetron  (ZOFRAN -ODT) 4 MG disintegrating tablet Take 1 tablet (4 mg total) by mouth every 8 (eight) hours as needed for nausea or vomiting. (Patient not taking: Reported on 12/23/2023) 12 tablet 0 Not Taking   pantoprazole  (PROTONIX ) 20 MG tablet Take 1 tablet (20 mg total) by mouth 2 (two) times daily. (Patient not taking: Reported on 11/01/2023) 60 tablet 3     Allergies  Allergen Reactions   Antihistamines, Diphenhydramine-Type Anaphylaxis   Blueberry  Flavor [Flavoring Agent] Anaphylaxis   Citrus Hives and Other (See Comments)    Mouth Burns    Review of Systems: Negative except for what is mentioned in HPI.  Physical Exam: BP 125/74 (BP Location: Left Arm)   Pulse (!) 119   Temp 98.5 F (36.9 C) (Oral)   Resp 18   Ht 4' 11.5 (1.511 m)   Wt 80.3 kg   LMP 04/23/2023   BMI 35.15 kg/m  CONSTITUTIONAL: Well-developed, well-nourished female in no acute distress.  HENT:  Normocephalic, atraumatic, External right and left ear normal. Oropharynx is clear and moist EYES: Conjunctivae and EOM are normal. Pupils are equal, round, and reactive to light. No scleral icterus.  NECK: Normal range of motion, supple, no masses SKIN: Skin is warm and dry. No rash noted. Not diaphoretic. No erythema. No pallor. NEUROLOGIC: Alert and oriented to person, place, and time. Normal reflexes, muscle tone coordination. No cranial nerve deficit noted. PSYCHIATRIC: Normal mood and affect. Normal behavior. Normal judgment and thought content. CARDIOVASCULAR: Normal heart rate noted, regular rhythm RESPIRATORY: Effort and breath sounds normal, no problems with respiration noted ABDOMEN: Soft, nontender, nondistended, gravid. MUSCULOSKELETAL: Normal range of motion. No edema and no tenderness. 2+ distal pulses.  Cervical Exam: Dilatation 4cm   Effacement 80%   Station -2 (chaperone Engineer, Structural, CHARITY FUNDRAISER)   Presentation: cephalic FHT:  Baseline rate 135 bpm    Variability moderate  Accelerations present   Decelerations none Contractions: Every 3-5 mins  Bedside ultrasound: Cephalic presentation of fetus   Pertinent Labs/Studies:   No results found for this or any previous visit (from the past 24 hours).  Assessment : Denene Alamillo is a 23 y.o. G2P0010 at [redacted]w[redacted]d being admitted for preterm labor.  Plan: Labor: Expectant management.  Analgesia as needed. Analgesia: IV Fentanyl  as needed, can also get epidural if needed FWB: Reassuring fetal heart tracing, Category 1. GDM: Q4H CBG checks for now, cover with sliding scale if needed ID:  GBS positive > PCN Delivery plan: Hopeful for vaginal delivery if preterm labor continues to progress.  Neonatology aware of this admission.   GLORIS HUGGER, MD, FACOG Obstetrician & Gynecologist, [facility] for [employer], [facility]

## 2023-12-24 NOTE — Progress Notes (Signed)
 Labor Progress Note Bethanee Redondo is a 23 y.o. G2P0010 at [redacted]w[redacted]d presenting for preterm labor.  S:  Patient was sleeping comfortably, and reported no concerns at this time.   O:  BP 127/72   Pulse (!) 132   Temp 98.5 F (36.9 C) (Oral)   Resp 18   Ht 4' 11.5 (1.511 m)   Wt 80.3 kg   LMP 04/23/2023   BMI 35.15 kg/m  EFM: 140 bpm/moderate variability/+ accels - decels  CVE: Dilation: 4 Effacement (%): 80 Cervical Position: Posterior Station: -2 Presentation: Vertex Exam by:: Anyanwu, MD   A&P: 23 y.o. G2P0010 [redacted]w[redacted]d presenting for preterm labor.   #Labor: Progressing well.  #Pain: IV fentanyl  as needed. Epidural offered. #FWB: Category I, but interrupted due to movement. #GBS positive, PCN x 1  Nunzio DELENA Cena Verma, Medical Student 5:19 AM

## 2023-12-24 NOTE — Progress Notes (Signed)
 Pt sitting up in bed eating breakfast

## 2023-12-25 ENCOUNTER — Ambulatory Visit

## 2023-12-25 ENCOUNTER — Ambulatory Visit: Payer: Self-pay | Admitting: Obstetrics & Gynecology

## 2023-12-25 ENCOUNTER — Other Ambulatory Visit

## 2023-12-26 ENCOUNTER — Encounter: Payer: Self-pay | Admitting: Obstetrics and Gynecology

## 2023-12-26 ENCOUNTER — Ambulatory Visit (INDEPENDENT_AMBULATORY_CARE_PROVIDER_SITE_OTHER): Admitting: Obstetrics and Gynecology

## 2023-12-26 VITALS — BP 120/76 | HR 99 | Wt 178.0 lb

## 2023-12-26 DIAGNOSIS — O365930 Maternal care for other known or suspected poor fetal growth, third trimester, not applicable or unspecified: Secondary | ICD-10-CM | POA: Diagnosis not present

## 2023-12-26 DIAGNOSIS — Z3480 Encounter for supervision of other normal pregnancy, unspecified trimester: Secondary | ICD-10-CM

## 2023-12-26 DIAGNOSIS — B951 Streptococcus, group B, as the cause of diseases classified elsewhere: Secondary | ICD-10-CM

## 2023-12-26 DIAGNOSIS — O24415 Gestational diabetes mellitus in pregnancy, controlled by oral hypoglycemic drugs: Secondary | ICD-10-CM

## 2023-12-26 DIAGNOSIS — Z3A35 35 weeks gestation of pregnancy: Secondary | ICD-10-CM

## 2023-12-26 NOTE — Progress Notes (Signed)
 PRENATAL VISIT NOTE  Subjective:  Vicki Clayton is a 23 y.o. G2P0010 at [redacted]w[redacted]d being seen today for ongoing prenatal care.  She is currently monitored for the following issues for this high-risk pregnancy and has Supervision of other normal pregnancy, antepartum; History of heart murmur in childhood; History of seizure; Hyperemesis gravidarum; Obesity affecting pregnancy, antepartum; Spotting affecting pregnancy in third trimester; Gestational diabetes mellitus (GDM) controlled on oral hypoglycemic drug; Poor fetal growth affecting management of mother in third trimester (resolved); Preterm labor in third trimester; Group beta Strep positive; and Labor and delivery, indication for care on their problem list.  Patient reports contractions since hospital discharge.  Contractions: Irregular. Vag. Bleeding: None.  Movement: Present. Denies leaking of fluid.   The following portions of the patient's history were reviewed and updated as appropriate: allergies, current medications, past family history, past medical history, past social history, past surgical history and problem list.   Objective:   Vitals:   12/26/23 1104  BP: 120/76  Pulse: 99  Weight: 178 lb (80.7 kg)    Fetal Status:  Fetal Heart Rate (bpm): 140 Fundal Height: 35 cm Movement: Present    General: Alert, oriented and cooperative. Patient is in no acute distress.  Skin: Skin is warm and dry. No rash noted.   Cardiovascular: Normal heart rate noted  Respiratory: Normal respiratory effort, no problems with respiration noted  Abdomen: Soft, gravid, appropriate for gestational age.  Pain/Pressure: Present     Pelvic: Cervical exam performed in the presence of a chaperone Dilation: 4 Effacement (%): 80 Station: -3  Extremities: Normal range of motion.     Mental Status: Normal mood and affect. Normal behavior. Normal judgment and thought content.      11/01/2023    9:32 AM 08/28/2023   11:15 AM 07/03/2023   11:46 AM   Depression screen PHQ 2/9  Decreased Interest 1 2 0  Down, Depressed, Hopeless 1 1 0  PHQ - 2 Score 2 3 0  Altered sleeping 1 2 1   Tired, decreased energy 0 1 1  Change in appetite 0 3 0  Feeling bad or failure about yourself  0 0 0  Trouble concentrating 0 0 0  Moving slowly or fidgety/restless 0 0 0  Suicidal thoughts 0 0 0  PHQ-9 Score 3  9  2    Difficult doing work/chores Not difficult at all       Data saved with a previous flowsheet row definition        11/01/2023    9:32 AM 08/28/2023   11:15 AM 07/03/2023   11:46 AM 05/17/2021   11:00 AM  GAD 7 : Generalized Anxiety Score  Nervous, Anxious, on Edge 1 2 0 0  Control/stop worrying 1 1 0 0  Worry too much - different things 1 2 1 3   Trouble relaxing 1 1 1 1   Restless 0 0 0 3  Easily annoyed or irritable 1 0 0 3  Afraid - awful might happen 0 2 0 0  Total GAD 7 Score 5 8 2 10   Anxiety Difficulty Not difficult at all       Assessment and Plan:  Pregnancy: G2P0010 at [redacted]w[redacted]d 1. Supervision of other normal pregnancy, antepartum (Primary) Patient is uncomfortable with contractions Cervical exam unchanged since 11/23 hospitalization. Preterm labor precautions reviewed  2. Gestational diabetes mellitus (GDM) in third trimester controlled on oral hypoglycemic drug Patient did not bring CBG log and reports highest fasting of 98 and pp 120  Continue metformin  at bedtime Follow up growth on 12/10  3. Poor fetal growth affecting management of mother in third trimester, single or unspecified fetus Resolved on last scan  4. Preterm labor in third trimester without delivery  5. Group beta Strep positive Prophylaxis in labor Repeat culture after 12/24 if undelivered  Preterm labor symptoms and general obstetric precautions including but not limited to vaginal bleeding, contractions, leaking of fluid and fetal movement were reviewed in detail with the patient. Please refer to After Visit Summary for other counseling  recommendations.   Return in about 1 week (around 01/02/2024) for in person, ROB, High risk.  Future Appointments  Date Time Provider Department Center  01/09/2024 11:15 AM Fredirick Glenys RAMAN, MD [facility]  01/10/2024  7:15 AM [facility] PROVIDER 1 [facility]  01/10/2024  7:30 AM [facility] US1 [facility]  01/23/2024  9:55 AM Fredirick Glenys RAMAN, MD [facility]    Winton Felt, MD

## 2023-12-30 ENCOUNTER — Encounter (HOSPITAL_COMMUNITY): Payer: Self-pay | Admitting: Obstetrics and Gynecology

## 2023-12-30 ENCOUNTER — Inpatient Hospital Stay (HOSPITAL_COMMUNITY): Admission: AD | Admit: 2023-12-30 | Discharge: 2023-12-31 | Disposition: A | Payer: Self-pay

## 2023-12-30 ENCOUNTER — Inpatient Hospital Stay (HOSPITAL_COMMUNITY)

## 2023-12-30 ENCOUNTER — Other Ambulatory Visit: Payer: Self-pay

## 2023-12-30 DIAGNOSIS — Z3A35 35 weeks gestation of pregnancy: Secondary | ICD-10-CM | POA: Insufficient documentation

## 2023-12-30 DIAGNOSIS — O368130 Decreased fetal movements, third trimester, not applicable or unspecified: Secondary | ICD-10-CM

## 2023-12-30 DIAGNOSIS — O4703 False labor before 37 completed weeks of gestation, third trimester: Secondary | ICD-10-CM | POA: Diagnosis not present

## 2023-12-30 DIAGNOSIS — O479 False labor, unspecified: Secondary | ICD-10-CM

## 2023-12-30 LAB — WET PREP, GENITAL
Clue Cells Wet Prep HPF POC: NONE SEEN
Sperm: NONE SEEN
Trich, Wet Prep: NONE SEEN
WBC, Wet Prep HPF POC: >=10 — AB (ref <10)
Yeast Wet Prep HPF POC: NONE SEEN

## 2023-12-30 LAB — POCT FERN TEST: POCT Fern Test: NEGATIVE

## 2023-12-30 LAB — RUPTURE OF MEMBRANE (ROM)PLUS: Rom Plus: NEGATIVE

## 2023-12-30 MED ORDER — NIFEDIPINE 10 MG PO CAPS: 10.0000 mg | ORAL_CAPSULE | ORAL | Status: DC | PRN

## 2023-12-30 NOTE — MAU Note (Cosign Needed)
 Maternal Assessment Unit Provider Note  Subjective: [name] is a 23 y.o. G2P0010 pregnant female at [redacted]w[redacted]d who presents to MAU today with complaint of contractions.   Notably she was admitted due to preterm labor concern on 11/23 and discharged home.  Cervical dilation documented in the office on 11/25 was 4/80/-3.  Patient reports contractions since 4 to 5 AM which intensified at 6 AM.  No vaginal bleeding.  Reports there are 3 minutes apart.  Thinks she may have had some leaking fluid, but is also being treated with 7-day vaginal miconazole course for yeast infection.  She also notes decreased fetal movement.  Last normal movement noted at 10 PM last night.  Still not moving normally in the MAU.  Receives care at [facility]. Prenatal records reviewed.  Pertinent items noted in HPI and remainder of comprehensive ROS otherwise negative.   Objective: BP 110/71   Pulse (!) 104   Temp 97.9 F (36.6 C) (Oral)   Resp 18   LMP 04/23/2023   SpO2 100%  Physical Exam Vitals reviewed.  Constitutional:      General: She is not in acute distress.    Appearance: She is well-developed. She is not diaphoretic.  HENT:     Head: Normocephalic and atraumatic.  Eyes:     General: No scleral icterus. Cardiovascular:     Rate and Rhythm: Regular rhythm. Tachycardia present.  Pulmonary:     Effort: Pulmonary effort is normal. No respiratory distress.     Breath sounds: Normal breath sounds.  Abdominal:     General: There is no distension.     Palpations: Abdomen is soft.     Tenderness: There is no abdominal tenderness. There is no guarding or rebound.     Comments: Gravid uterus.   Skin:    General: Skin is warm and dry.  Neurological:     General: No focal deficit present.     Mental Status: She is alert.   SVE: 3/80/-2 Fetal position vertex by leopold's.  Results for orders placed or performed during the hospital encounter of 12/30/23 (from the past 24 hours)   Fern Test     Status: Normal   Collection Time: 12/30/23 10:05 PM  Result Value Ref Range   POCT Fern Test Negative = intact amniotic membranes   Wet prep, genital     Status: Abnormal   Collection Time: 12/30/23 10:38 PM   Specimen: Vaginal  Result Value Ref Range   Yeast Wet Prep HPF POC NONE SEEN NONE SEEN   Trich, Wet Prep NONE SEEN NONE SEEN   Clue Cells Wet Prep HPF POC NONE SEEN NONE SEEN   WBC, Wet Prep HPF POC >=10 (A) <10   Sperm NONE SEEN   Rupture of Membrane (ROM) Plus     Status: None   Collection Time: 12/30/23 10:38 PM  Result Value Ref Range   Rom Plus NEGATIVE      MDM: High risk--preterm labor and fetal well-being concern  MAU Course:  Time: 12:19 AM Reviewed results of BPP, vag swabs, UA, and ROM+ Cervical exam unchanged after >12 hours of contractions Encouraged PO hydration  FHT: baseline 150 bpm, moderate variability, + accelerations, occasional variable decelerations, reactive and reassuring  Contractions: uterine irritability  BPP 8/8, marginal cord insertion previously seen.  Time: 12:19 AM  Reassessed patient. She reports contraction strength has decreased. Not showing up on monitor.  Fetal movement increased & patient reassured  Offered therapeutic rest with acetaminophen   and zolpidem  script which patient accepted.  Questions were answered to the satisfaction of the patient and/or family prior to discharge.   Assessment Medical screening exam complete    ICD-10-CM   1. False labor  O47.9     2. Decreased fetal movements in third trimester, single or unspecified fetus  O36.8130     3. [redacted] weeks gestation of pregnancy  Z3A.35      Plan Encouraged PO hydration Therapeutic rest with acetaminophen /zolpidem  Pt reassured re: fetal movement returned to normal with normal BPP  Discharge from MAU in stable condition with strict return/labor precautions  Follow [facility] at [facility] as scheduled for ongoing prenatal care  Allergies as  of 12/31/2023       Reactions   Antihistamines, Diphenhydramine -type Anaphylaxis   Blueberry Flavor [flavoring Agent] Anaphylaxis   Citrus Hives, Other (See Comments)   Mouth Burns        Medication List     TAKE these medications    Accu-Chek Guide Test test strip Generic drug: glucose blood 1 each by Other route 4 (four) times daily. Use as instructed   Accu-Chek Guide w/Device Kit 1 Device by Does not apply route 4 (four) times daily.   Accu-Chek Softclix Lancets lancets Use as instructed   acetaminophen  500 MG tablet Commonly known as: TYLENOL  Take 2 tablets (1,000 mg total) by mouth every 6 (six) hours as needed for moderate pain (pain score 4-6) or mild pain (pain score 1-3) (Take at night with zolpidem  for therapeutic rest with contractions).   famotidine  20 MG tablet Commonly known as: Pepcid  Take 1 tablet (20 mg total) by mouth 2 (two) times daily.   metFORMIN  500 MG tablet Commonly known as: GLUCOPHAGE  Take 1 tablet (500 mg total) by mouth 2 (two) times daily with a meal.   metroNIDAZOLE  500 MG tablet Commonly known as: FLAGYL  Take 1 tablet (500 mg total) by mouth 2 (two) times daily.   Prenatal 28-0.8 MG Tabs Take 1 tablet by mouth daily.   terconazole  0.4 % vaginal cream Commonly known as: TERAZOL 7  Place 1 applicator vaginally at bedtime. Use for seven days   zolpidem  5 MG tablet Commonly known as: AMBIEN  Take 1 tablet (5 mg total) by mouth at bedtime as needed for sleep (for therapeutic rest when having contractions together with acetaminophen ).        Trudy Leeroy NOVAK, MD 12/31/2023 12:19 AM

## 2023-12-30 NOTE — MAU Note (Signed)
 Vicki Clayton is a 23 y.o. at [redacted]w[redacted]d here in MAU reporting: contractions every 3 minutes since 0400 today as well as possible LOF (clear) since 12/29/23 afternoon-unsure what time but feels she has continued to leak fluid. Patient reports baby is not moving as much- last movement around 8pm today. Denies VB or recent intercourse. Of note patient was admitted for observation for PTL on 11/23 and was d/c'd home at 3.5 cm. No other complaints today.   Onset of complaint: 12/29/23 Pain score: 9/10 Vitals:   12/30/23 2144  BP: 109/64  Pulse: (!) 117  Resp: 18  Temp: 97.9 F (36.6 C)     FHT: 155  Lab orders placed from triage: fern test, UA

## 2023-12-31 MED ORDER — ZOLPIDEM TARTRATE 5 MG PO TABS: 5.0000 mg | ORAL_TABLET | Freq: Every evening | ORAL | 0 refills | Status: DC | PRN

## 2023-12-31 MED ORDER — ACETAMINOPHEN 500 MG PO TABS: 1000.0000 mg | ORAL_TABLET | Freq: Four times a day (QID) | ORAL | 0 refills | Status: DC | PRN

## 2023-12-31 NOTE — Discharge Instructions (Signed)
 For therapeutic rest..If you are having regular contractions and are unable to sleep at night drink 1-2 large glasses of water and take the prescribed 1000mg  of acetaminophen  together with 5mg  of Ambien (zolpidem) to sleep. Do not drive for 8 hours after taking Ambien as it will make you very drowsy.  Call your OB Clinic or go to [facility] if: You begin to have stronger, frequent contractions Your water breaks.  Sometimes it is a big gush of fluid, sometimes it is just a trickle that keeps getting your panties wet or running down your legs You have vaginal bleeding.  It is normal to have a small amount of spotting if your cervix was checked.  You don't feel your baby moving like normal.  If you don't, get you something to eat and drink and lay down and focus on feeling your baby move.  You should feel at least 10 movements in 2 hours.  If you don't, you should call the office or go to [facility].

## 2024-01-01 ENCOUNTER — Other Ambulatory Visit

## 2024-01-01 ENCOUNTER — Ambulatory Visit

## 2024-01-01 LAB — GC/CHLAMYDIA PROBE AMP (~~LOC~~) NOT AT ARMC
Chlamydia: NEGATIVE
Comment: NEGATIVE
Comment: NORMAL
Neisseria Gonorrhea: NEGATIVE

## 2024-01-05 ENCOUNTER — Inpatient Hospital Stay (HOSPITAL_COMMUNITY)
Admit: 2024-01-05 | Discharge: 2024-01-05 | Disposition: A | Payer: Self-pay | Attending: Family Medicine | Admitting: Family Medicine

## 2024-01-05 ENCOUNTER — Other Ambulatory Visit: Payer: Self-pay

## 2024-01-05 ENCOUNTER — Encounter (HOSPITAL_COMMUNITY): Payer: Self-pay | Admitting: Family Medicine

## 2024-01-05 DIAGNOSIS — Z3A36 36 weeks gestation of pregnancy: Secondary | ICD-10-CM | POA: Diagnosis not present

## 2024-01-05 DIAGNOSIS — O99613 Diseases of the digestive system complicating pregnancy, third trimester: Secondary | ICD-10-CM | POA: Diagnosis not present

## 2024-01-05 DIAGNOSIS — K117 Disturbances of salivary secretion: Secondary | ICD-10-CM

## 2024-01-05 DIAGNOSIS — O219 Vomiting of pregnancy, unspecified: Secondary | ICD-10-CM

## 2024-01-05 DIAGNOSIS — B951 Streptococcus, group B, as the cause of diseases classified elsewhere: Secondary | ICD-10-CM | POA: Diagnosis not present

## 2024-01-05 LAB — CBC
HCT: 30 % — ABNORMAL LOW (ref 36.0–46.0)
Hemoglobin: 9.9 g/dL — ABNORMAL LOW (ref 12.0–15.0)
MCH: 30.1 pg (ref 26.0–34.0)
MCHC: 33 g/dL (ref 30.0–36.0)
MCV: 91.2 fL (ref 80.0–100.0)
Platelets: 246 K/uL (ref 150–400)
RBC: 3.29 MIL/uL — ABNORMAL LOW (ref 3.87–5.11)
RDW: 14.3 % (ref 11.5–15.5)
WBC: 9.9 K/uL (ref 4.0–10.5)
nRBC: 0 % (ref 0.0–0.2)

## 2024-01-05 LAB — COMPREHENSIVE METABOLIC PANEL WITH GFR
ALT: 12 U/L (ref 0–44)
AST: 17 U/L (ref 15–41)
Albumin: 2.6 g/dL — ABNORMAL LOW (ref 3.5–5.0)
Alkaline Phosphatase: 185 U/L — ABNORMAL HIGH (ref 38–126)
Anion gap: 7 (ref 5–15)
BUN: 7 mg/dL (ref 6–20)
CO2: 23 mmol/L (ref 22–32)
Calcium: 8.6 mg/dL — ABNORMAL LOW (ref 8.9–10.3)
Chloride: 104 mmol/L (ref 98–111)
Creatinine, Ser: 0.71 mg/dL (ref 0.44–1.00)
GFR, Estimated: >60 mL/min (ref >60)
Glucose, Bld: 91 mg/dL (ref 70–99)
Potassium: 3.5 mmol/L (ref 3.5–5.1)
Sodium: 134 mmol/L — ABNORMAL LOW (ref 135–145)
Total Bilirubin: 1.7 mg/dL — ABNORMAL HIGH (ref 0.0–1.2)
Total Protein: 6.2 g/dL — ABNORMAL LOW (ref 6.5–8.1)

## 2024-01-05 LAB — TROPONIN I (HIGH SENSITIVITY): Troponin I (High Sensitivity): 6 ng/L (ref <18)

## 2024-01-05 MED ORDER — LACTATED RINGERS IV BOLUS
1000.0000 mL | Freq: Once | INTRAVENOUS | Status: AC
  Administered 2024-01-05: 1000 mL via INTRAVENOUS

## 2024-01-05 MED ORDER — FAMOTIDINE IN NACL 20-0.9 MG/50ML-% IV SOLN
20.0000 mg | Freq: Once | INTRAVENOUS | Status: AC
  Administered 2024-01-05: 20 mg via INTRAVENOUS
  Filled 2024-01-05: qty 50

## 2024-01-05 MED ORDER — GLYCOPYRROLATE 1 MG PO TABS: 1.0000 mg | ORAL_TABLET | Freq: Three times a day (TID) | ORAL | 0 refills | Status: DC | PRN

## 2024-01-05 MED ORDER — SODIUM CHLORIDE 0.9 % IV SOLN
8.0000 mg | Freq: Once | INTRAVENOUS | Status: AC
  Administered 2024-01-05: 8 mg via INTRAVENOUS
  Filled 2024-01-05: qty 4

## 2024-01-05 MED ORDER — ONDANSETRON 4 MG PO TBDP: 4.0000 mg | ORAL_TABLET | Freq: Three times a day (TID) | ORAL | 0 refills | Status: DC | PRN

## 2024-01-05 NOTE — MAU Provider Note (Signed)
 History     CSN: [id number]  Arrival date and time: 01/05/24 1550   Event Date/Time   First Provider Initiated Contact with Patient 01/05/24 1645      Chief Complaint  Patient presents with   Emesis   Nausea   Generalized Body Aches   Dizziness   HPI Vicki Clayton is a 23 y.o. G2P0010 at [redacted]w[redacted]d who presents for n/v, heartburn, & body aches.  States previously had issues with vomiting and spitting during pregnancy.  Those symptoms resolved and she stopped taking her medications.  Current symptoms started on Monday.  States she has not been able to keep down food or liquids since Sunday.  Denies sick contacts.  Symptoms include vomiting, spitting, heartburn, chest pain, body aches, headache.  Continues to have contractions but states they are comparable to previous visits.  Denies fever, sore throat, cough.  States chest pain is worse after vomiting.  Denies shortness of breath.  Good fetal movement.  Denies fluid or vaginal bleeding.  OB History     Gravida  2   Para  0   Term  0   Preterm  0   AB  1   Living  0      SAB  1   IAB  0   Ectopic  0   Multiple  0   Live Births  0           Past Medical History:  Diagnosis Date   Anemia    Appendicitis    Heart murmur of newborn    Psychogenic nonepileptic seizure    Vasovagal syncope     Past Surgical History:  Procedure Laterality Date   ELBOW CLOSED REDUCTION W/ PERCUANEOUS PINNING     ELBOW SURGERY     KNEE ARTHROSCOPY WITH ANTERIOR CRUCIATE LIGAMENT (ACL) REPAIR Left 05/30/2019   Procedure: KNEE ARTHROSCOPY WITH ANTERIOR CRUCIATE LIGAMENT (ACL) REPAIR WITH QUADRICEPS AUTOGRAFT;  Surgeon: Cristy Bonner DASEN, MD;  Location: [facility];  Service: Orthopedics;  Laterality: Left;    Family History  Problem Relation Age of Onset   Cancer Mother    Hypertension Mother    Cancer Father    Breast cancer Maternal Grandmother    Diabetes Maternal Grandmother    Breast cancer Paternal  Grandmother    Heart disease Other    Diabetes Other    Hypertension Other    Cancer Other    Migraines Neg Hx    Seizures Neg Hx    Depression Neg Hx    Anxiety disorder Neg Hx    Bipolar disorder Neg Hx    Schizophrenia Neg Hx    ADD / ADHD Neg Hx    Autism Neg Hx     Social History   Tobacco Use   Smoking status: Never    Passive exposure: Never   Smokeless tobacco: Never  Vaping Use   Vaping status: Never Used  Substance Use Topics   Alcohol use: Not Currently   Drug use: No    Allergies:  Allergies  Allergen Reactions   Antihistamines, Diphenhydramine-Type Anaphylaxis   Blueberry Flavor [Flavoring Agent] Anaphylaxis   Citrus Hives and Other (See Comments)    Mouth Burns    Facility-Administered Medications Prior to Admission  Medication Dose Route Frequency Provider Last Rate Last Admin   nystatin -triamcinolone  (MYCOLOG II) cream   Topical BID        Medications Prior to Admission  Medication Sig Dispense Refill Last Dose/Taking  acetaminophen  (TYLENOL ) 500 MG tablet Take 2 tablets (1,000 mg total) by mouth every 6 (six) hours as needed for moderate pain (pain score 4-6) or mild pain (pain score 1-3) (Take at night with zolpidem  for therapeutic rest with contractions). 30 tablet 0 01/04/2024   famotidine  (PEPCID ) 20 MG tablet Take 1 tablet (20 mg total) by mouth 2 (two) times daily. 30 tablet 0 Past Week   metFORMIN  (GLUCOPHAGE ) 500 MG tablet Take 1 tablet (500 mg total) by mouth 2 (two) times daily with a meal. 60 tablet 5 01/04/2024   metroNIDAZOLE  (FLAGYL ) 500 MG tablet Take 1 tablet (500 mg total) by mouth 2 (two) times daily. 14 tablet 0 01/04/2024   Prenatal 28-0.8 MG TABS Take 1 tablet by mouth daily. 30 tablet 12 01/05/2024   terconazole  (TERAZOL 7 ) 0.4 % vaginal cream Place 1 applicator vaginally at bedtime. Use for seven days 45 g 0 01/04/2024   zolpidem  (AMBIEN ) 5 MG tablet Take 1 tablet (5 mg total) by mouth at bedtime as needed for sleep (for  therapeutic rest when having contractions together with acetaminophen ). 3 tablet 0 Past Week   Accu-Chek Softclix Lancets lancets Use as instructed 100 each 12    Blood Glucose Monitoring Suppl (ACCU-CHEK GUIDE) w/Device KIT 1 Device by Does not apply route 4 (four) times daily. 1 kit 0    glucose blood (ACCU-CHEK GUIDE TEST) test strip 1 each by Other route 4 (four) times daily. Use as instructed 100 each 12     Review of Systems  All other systems reviewed and are negative.  Physical Exam   Blood pressure 103/61, pulse (!) 102, temperature 99.1 F (37.3 C), resp. rate 15, weight 76.9 kg, last menstrual period 04/23/2023, SpO2 97%.  Physical Exam Vitals and nursing note reviewed. Exam conducted with a chaperone present.  Constitutional:      General: She is not in acute distress.    Appearance: Normal appearance. She is not ill-appearing.  HENT:     Head: Normocephalic and atraumatic.  Eyes:     General: No scleral icterus.    Pupils: Pupils are equal, round, and reactive to light.  Pulmonary:     Effort: Pulmonary effort is normal. No respiratory distress.  Abdominal:     Tenderness: There is no abdominal tenderness.     Comments: gravid  Skin:    General: Skin is warm and dry.  Neurological:     Mental Status: She is alert.    NST:  Baseline: 150 bpm, Variability: Good {> 6 bpm), Accelerations: Reactive, and Decelerations: Absent   MAU Course  Procedures Results for orders placed or performed during the hospital encounter of 01/05/24 (from the past 24 hours)  CBC     Status: Abnormal   Collection Time: 01/05/24  5:36 PM  Result Value Ref Range   WBC 9.9 4.0 - 10.5 K/uL   RBC 3.29 (L) 3.87 - 5.11 MIL/uL   Hemoglobin 9.9 (L) 12.0 - 15.0 g/dL   HCT 69.9 (L) 63.9 - 53.9 %   MCV 91.2 80.0 - 100.0 fL   MCH 30.1 26.0 - 34.0 pg   MCHC 33.0 30.0 - 36.0 g/dL   RDW 85.6 88.4 - 84.4 %   Platelets 246 150 - 400 K/uL   nRBC 0.0 0.0 - 0.2 %  Comprehensive metabolic panel  with GFR     Status: Abnormal   Collection Time: 01/05/24  5:36 PM  Result Value Ref Range   Sodium 134 (L) 135 -  145 mmol/L   Potassium 3.5 3.5 - 5.1 mmol/L   Chloride 104 98 - 111 mmol/L   CO2 23 22 - 32 mmol/L   Glucose, Bld 91 70 - 99 mg/dL   BUN 7 6 - 20 mg/dL   Creatinine, Ser 9.28 0.44 - 1.00 mg/dL   Calcium 8.6 (L) 8.9 - 10.3 mg/dL   Total Protein 6.2 (L) 6.5 - 8.1 g/dL   Albumin 2.6 (L) 3.5 - 5.0 g/dL   AST 17 15 - 41 U/L   ALT 12 0 - 44 U/L   Alkaline Phosphatase 185 (H) 38 - 126 U/L   Total Bilirubin 1.7 (H) 0.0 - 1.2 mg/dL   GFR, Estimated >39 >39 mL/min   Anion gap 7 5 - 15  Troponin I (High Sensitivity)     Status: None   Collection Time: 01/05/24  5:36 PM  Result Value Ref Range   Troponin I (High Sensitivity) 6 <18 ng/L    MDM   Assessment and Plan   1. Nausea and vomiting during pregnancy   2. Sialorrhea in antepartum period in third trimester   3. [redacted] weeks gestation of pregnancy    -Tx with IV fluids, zofran , & pepcid . Reports improvement in symptoms & able to tolerate oral fluids. Requests discharge home with refill for meds including robinul . Rx sent.  -Chest pain likely related n/v & acid. EKG & troponin normal. Symptoms improved with meds.  -Patient no contracting regularly & reports they have not worsened since last MAU visit. Declines cervical exam -Reviewed return precautions  Rocky Satterfield 01/05/2024, 7:50 PM

## 2024-01-05 NOTE — MAU Note (Addendum)
 Vicki Clayton is a 23 y.o. at [redacted]w[redacted]d here in MAU reporting: N/V 5 times in last 24 hours, all over body aches, HA, contractions, chest pain, and dizziness.  Last able to hold anything down since Sunday.  Spitting since Monday Denies UTI S/S She reports +FMs, Denies LOF, VB, regular contractions, blurry vision, peripheral edema, or RUQ pain.  LMP: - Onset of complaint: Sunday Pain score: UC- 9/10, HA 8/10, chest pain 8/10 Vitals:   01/05/24 1622  BP: 116/65  Pulse: (!) 108  Resp: 15  Temp: 99.1 F (37.3 C)  SpO2: 98%     FHT: 164  Lab orders placed from triage: ua

## 2024-01-06 ENCOUNTER — Encounter: Payer: Self-pay | Admitting: Certified Nurse Midwife

## 2024-01-07 ENCOUNTER — Inpatient Hospital Stay (HOSPITAL_COMMUNITY): Admitting: Anesthesiology

## 2024-01-07 ENCOUNTER — Inpatient Hospital Stay (HOSPITAL_COMMUNITY)
Admission: AD | Admit: 2024-01-07 | Discharge: 2024-01-09 | DRG: 807 | Disposition: A | Attending: Obstetrics and Gynecology | Admitting: Obstetrics and Gynecology

## 2024-01-07 ENCOUNTER — Other Ambulatory Visit: Payer: Self-pay

## 2024-01-07 ENCOUNTER — Encounter (HOSPITAL_COMMUNITY): Payer: Self-pay | Admitting: Obstetrics and Gynecology

## 2024-01-07 DIAGNOSIS — B951 Streptococcus, group B, as the cause of diseases classified elsewhere: Secondary | ICD-10-CM | POA: Diagnosis present

## 2024-01-07 DIAGNOSIS — O24415 Gestational diabetes mellitus in pregnancy, controlled by oral hypoglycemic drugs: Secondary | ICD-10-CM | POA: Diagnosis present

## 2024-01-07 DIAGNOSIS — Z3480 Encounter for supervision of other normal pregnancy, unspecified trimester: Secondary | ICD-10-CM

## 2024-01-07 DIAGNOSIS — Z87898 Personal history of other specified conditions: Secondary | ICD-10-CM

## 2024-01-07 DIAGNOSIS — Z3A37 37 weeks gestation of pregnancy: Secondary | ICD-10-CM

## 2024-01-07 DIAGNOSIS — Z8679 Personal history of other diseases of the circulatory system: Secondary | ICD-10-CM

## 2024-01-07 DIAGNOSIS — O99214 Obesity complicating childbirth: Secondary | ICD-10-CM | POA: Diagnosis not present

## 2024-01-07 DIAGNOSIS — O24424 Gestational diabetes mellitus in childbirth, insulin controlled: Secondary | ICD-10-CM | POA: Diagnosis not present

## 2024-01-07 DIAGNOSIS — O99210 Obesity complicating pregnancy, unspecified trimester: Secondary | ICD-10-CM | POA: Diagnosis present

## 2024-01-07 DIAGNOSIS — O99820 Streptococcus B carrier state complicating pregnancy: Secondary | ICD-10-CM | POA: Diagnosis not present

## 2024-01-07 LAB — GLUCOSE, CAPILLARY: Glucose-Capillary: 95 mg/dL (ref 70–99)

## 2024-01-07 LAB — CBC
HCT: 27.6 % — ABNORMAL LOW (ref 36.0–46.0)
Hemoglobin: 9.5 g/dL — ABNORMAL LOW (ref 12.0–15.0)
MCH: 30.7 pg (ref 26.0–34.0)
MCHC: 34.4 g/dL (ref 30.0–36.0)
MCV: 89.3 fL (ref 80.0–100.0)
Platelets: 217 K/uL (ref 150–400)
RBC: 3.09 MIL/uL — ABNORMAL LOW (ref 3.87–5.11)
RDW: 14.5 % (ref 11.5–15.5)
WBC: 11.7 K/uL — ABNORMAL HIGH (ref 4.0–10.5)
nRBC: 0 % (ref 0.0–0.2)

## 2024-01-07 LAB — TYPE AND SCREEN
ABO/RH(D): O POS
Antibody Screen: NEGATIVE

## 2024-01-07 MED ORDER — IBUPROFEN 600 MG PO TABS
600.0000 mg | ORAL_TABLET | Freq: Four times a day (QID) | ORAL | Status: DC
  Administered 2024-01-07 – 2024-01-09 (×6): 600 mg via ORAL
  Filled 2024-01-07 (×7): qty 1

## 2024-01-07 MED ORDER — PHENYLEPHRINE 80 MCG/ML (10ML) SYRINGE FOR IV PUSH (FOR BLOOD PRESSURE SUPPORT): 80.0000 ug | PREFILLED_SYRINGE | INTRAVENOUS | Status: DC | PRN

## 2024-01-07 MED ORDER — OXYTOCIN BOLUS FROM INFUSION
333.0000 mL | Freq: Once | INTRAVENOUS | Status: AC
  Administered 2024-01-07: 333 mL via INTRAVENOUS

## 2024-01-07 MED ORDER — TRANEXAMIC ACID-NACL 1000-0.7 MG/100ML-% IV SOLN
INTRAVENOUS | Status: AC
  Filled 2024-01-07: qty 100

## 2024-01-07 MED ORDER — SENNOSIDES-DOCUSATE SODIUM 8.6-50 MG PO TABS: 2.0000 | ORAL_TABLET | Freq: Every day | ORAL | Status: DC

## 2024-01-07 MED ORDER — SODIUM CHLORIDE 0.9 % IV SOLN
2.0000 g | INTRAVENOUS | Status: AC
  Administered 2024-01-07: 2 g via INTRAVENOUS
  Filled 2024-01-07: qty 2000

## 2024-01-07 MED ORDER — OXYCODONE-ACETAMINOPHEN 5-325 MG PO TABS: 2.0000 | ORAL_TABLET | ORAL | Status: DC | PRN

## 2024-01-07 MED ORDER — LACTATED RINGERS IV SOLN: 500.0000 mL | Freq: Once | INTRAVENOUS | Status: DC

## 2024-01-07 MED ORDER — FENTANYL-BUPIVACAINE-NACL 0.5-0.125-0.9 MG/250ML-% EP SOLN
12.0000 mL/h | EPIDURAL | Status: DC | PRN
  Administered 2024-01-07: 12 mL/h via EPIDURAL
  Filled 2024-01-07: qty 250

## 2024-01-07 MED ORDER — WITCH HAZEL-GLYCERIN EX PADS: 1.0000 | MEDICATED_PAD | CUTANEOUS | Status: DC | PRN

## 2024-01-07 MED ORDER — DIPHENHYDRAMINE HCL 50 MG/ML IJ SOLN: 12.5000 mg | INTRAMUSCULAR | Status: DC | PRN

## 2024-01-07 MED ORDER — EPHEDRINE 5 MG/ML INJ: 10.0000 mg | INTRAVENOUS | Status: DC | PRN

## 2024-01-07 MED ORDER — ONDANSETRON HCL 4 MG PO TABS: 4.0000 mg | ORAL_TABLET | ORAL | Status: DC | PRN

## 2024-01-07 MED ORDER — DIBUCAINE (PERIANAL) 1 % EX OINT: 1.0000 | TOPICAL_OINTMENT | CUTANEOUS | Status: DC | PRN

## 2024-01-07 MED ORDER — PRENATAL MULTIVITAMIN CH
1.0000 | ORAL_TABLET | Freq: Every day | ORAL | Status: DC
  Administered 2024-01-08 – 2024-01-09 (×2): 1 via ORAL
  Filled 2024-01-07 (×2): qty 1

## 2024-01-07 MED ORDER — OXYTOCIN-SODIUM CHLORIDE 30-0.9 UT/500ML-% IV SOLN
2.5000 [IU]/h | INTRAVENOUS | Status: DC
  Administered 2024-01-07: 2.5 [IU]/h via INTRAVENOUS
  Filled 2024-01-07: qty 500

## 2024-01-07 MED ORDER — LACTATED RINGERS IV SOLN
500.0000 mL | Freq: Once | INTRAVENOUS | Status: AC
  Administered 2024-01-07: 500 mL via INTRAVENOUS

## 2024-01-07 MED ORDER — ACETAMINOPHEN 325 MG PO TABS: 650.0000 mg | ORAL_TABLET | ORAL | Status: DC | PRN

## 2024-01-07 MED ORDER — ONDANSETRON HCL 4 MG/2ML IJ SOLN: 4.0000 mg | INTRAMUSCULAR | Status: DC | PRN

## 2024-01-07 MED ORDER — SODIUM CHLORIDE 0.9 % IV SOLN
1.0000 g | INTRAVENOUS | Status: DC
  Filled 2024-01-07 (×4): qty 1000

## 2024-01-07 MED ORDER — SODIUM CHLORIDE 0.9 % IV SOLN: 5.0000 10*6.[IU] | Freq: Once | INTRAVENOUS | Status: DC

## 2024-01-07 MED ORDER — OXYCODONE-ACETAMINOPHEN 5-325 MG PO TABS: 1.0000 | ORAL_TABLET | ORAL | Status: DC | PRN

## 2024-01-07 MED ORDER — LACTATED RINGERS IV SOLN: 500.0000 mL | INTRAVENOUS | Status: DC | PRN

## 2024-01-07 MED ORDER — PENICILLIN G POT IN DEXTROSE 60000 UNIT/ML IV SOLN: 3.0000 10*6.[IU] | INTRAVENOUS | Status: DC

## 2024-01-07 MED ORDER — LIDOCAINE HCL (PF) 1 % IJ SOLN
INTRAMUSCULAR | Status: DC | PRN
  Administered 2024-01-07: 2 mL via EPIDURAL
  Administered 2024-01-07: 3 mL via EPIDURAL
  Administered 2024-01-07: 5 mL via EPIDURAL

## 2024-01-07 MED ORDER — LIDOCAINE HCL (PF) 1 % IJ SOLN: 30.0000 mL | INTRAMUSCULAR | Status: DC | PRN

## 2024-01-07 MED ORDER — ONDANSETRON HCL 4 MG/2ML IJ SOLN: 4.0000 mg | Freq: Four times a day (QID) | INTRAMUSCULAR | Status: DC | PRN

## 2024-01-07 MED ORDER — TETANUS-DIPHTH-ACELL PERTUSSIS 5-2-15.5 LF-MCG/0.5 IM SUSP: 0.5000 mL | Freq: Once | INTRAMUSCULAR | Status: DC

## 2024-01-07 MED ORDER — ACETAMINOPHEN 325 MG PO TABS
650.0000 mg | ORAL_TABLET | ORAL | Status: DC | PRN
  Administered 2024-01-08 – 2024-01-09 (×3): 650 mg via ORAL
  Filled 2024-01-07 (×3): qty 2

## 2024-01-07 MED ORDER — SODIUM CHLORIDE 0.9 % IV SOLN: 2.0000 g | INTRAVENOUS | Status: DC

## 2024-01-07 MED ORDER — LACTATED RINGERS IV BOLUS
1000.0000 mL | Freq: Once | INTRAVENOUS | Status: AC
  Administered 2024-01-07: 1000 mL via INTRAVENOUS

## 2024-01-07 MED ORDER — BENZOCAINE-MENTHOL 20-0.5 % EX AERO
1.0000 | INHALATION_SPRAY | CUTANEOUS | Status: DC | PRN
  Administered 2024-01-07 – 2024-01-08 (×2): 1 via TOPICAL
  Filled 2024-01-07: qty 56

## 2024-01-07 MED ORDER — TRANEXAMIC ACID-NACL 1000-0.7 MG/100ML-% IV SOLN
1000.0000 mg | INTRAVENOUS | Status: AC
  Administered 2024-01-07: 1000 mg via INTRAVENOUS

## 2024-01-07 MED ORDER — LACTATED RINGERS IV SOLN: INTRAVENOUS | Status: DC

## 2024-01-07 MED ORDER — SIMETHICONE 80 MG PO CHEW
80.0000 mg | CHEWABLE_TABLET | ORAL | Status: DC | PRN
  Administered 2024-01-08: 80 mg via ORAL
  Filled 2024-01-07: qty 1

## 2024-01-07 MED ORDER — FENTANYL CITRATE (PF) 100 MCG/2ML IJ SOLN: 50.0000 ug | INTRAMUSCULAR | Status: DC | PRN

## 2024-01-07 MED ORDER — COCONUT OIL OIL: 1.0000 | TOPICAL_OIL | Status: DC | PRN

## 2024-01-07 NOTE — Discharge Summary (Shared)
 Postpartum Discharge Summary  Date of Service updated***     Patient Name: Vicki Clayton DOB: 04/16/2000 MRN: [id number]  Date of admission: 01/07/2024 Delivery date:01/07/2024 Delivering provider: NICHOLAUS BURNARD HERO Date of discharge: 01/07/2024  Admitting diagnosis: Normal labor [O80, Z37.9] Intrauterine pregnancy: [redacted]w[redacted]d     Secondary diagnosis:  Principal Problem:   Normal labor Active Problems:   Supervision of other normal pregnancy, antepartum   History of heart murmur in childhood   History of seizure   Obesity affecting pregnancy, antepartum   Gestational diabetes mellitus (GDM) controlled on oral hypoglycemic drug   Group beta Strep positive  Additional problems: ***    Discharge diagnosis: {DX.:23714}                                              Post partum procedures:{Postpartum procedures:23558} Augmentation: N/A Complications: None  Hospital course: Onset of Labor With Vaginal Delivery      23 y.o. yo G2P0010 at [redacted]w[redacted]d was admitted in Latent Labor on 01/07/2024. Labor course was complicated by n/a  Membrane Rupture Time/Date: 1:25 PM,01/07/2024  Delivery Method:Vaginal, Spontaneous Operative Delivery:N/A Episiotomy: None Lacerations:  Periurethral;Vaginal Patient had a postpartum course complicated by ***.  She is ambulating, tolerating a regular diet, passing flatus, and urinating well. Patient is discharged home in stable condition on 01/07/24.  Newborn Data: Birth date:01/07/2024 Birth time:2:25 PM Gender:Female Living status:Living Apgars: ,  Weight:   Magnesium Sulfate received: {Mag received:30440022} BMZ received: {BMZ received:30440023} Rhophylac:{Rhophylac received:30440032} FFM:{FFM:69559966} T-DaP:{Tdap:23962} Flu: {Qol:76036} RSV Vaccine received: {RSV:31013} Transfusion:{Transfusion received:30440034}  Immunizations received: Immunization History  Administered Date(s) Administered   DTaP 07/05/2002, 09/04/2002, 11/11/2002,  06/06/2006   HIB (PRP-OMP) 07/05/2002, 09/04/2002, 11/11/2002   Hepatitis B 23-May-2002, 06/04/2002, 01/04/2003   IPV 07/05/2002, 09/04/2002, 07/18/2006   Influenza, Seasonal, Injecte, Preservative Fre 11/01/2023   MMR 06/06/2006, 07/18/2006   Pneumococcal Conjugate-13 07/05/2002, 09/04/2002, 11/11/2002   Tdap 11/01/2023   Varicella 06/06/2006, 12/22/2009    Physical exam  Vitals:   01/07/24 1345 01/07/24 1400 01/07/24 1445 01/07/24 1500  BP: 137/74 (!) 116/57 121/61 119/64  Pulse: (!) 105 (!) 102 (!) 103 (!) 102  Resp:      Temp:      TempSrc:      SpO2: 100%     Weight:      Height:       General: {Exam; general:21111117} Lochia: {Desc; appropriate/inappropriate:30686::appropriate} Uterine Fundus: {Desc; firm/soft:30687} Incision: {Exam; incision:21111123} DVT Evaluation: {Exam; icu:7888877} Labs: Lab Results  Component Value Date   WBC 11.7 (H) 01/07/2024   HGB 9.5 (L) 01/07/2024   HCT 27.6 (L) 01/07/2024   MCV 89.3 01/07/2024   PLT 217 01/07/2024      Latest Ref Rng & Units 01/05/2024    5:36 PM  CMP  Glucose 70 - 99 mg/dL 91   BUN 6 - 20 mg/dL 7   Creatinine 9.55 - 8.99 mg/dL 9.28   Sodium 864 - 854 mmol/L 134   Potassium 3.5 - 5.1 mmol/L 3.5   Chloride 98 - 111 mmol/L 104   CO2 22 - 32 mmol/L 23   Calcium 8.9 - 10.3 mg/dL 8.6   Total Protein 6.5 - 8.1 g/dL 6.2   Total Bilirubin 0.0 - 1.2 mg/dL 1.7   Alkaline Phos 38 - 126 U/L 185   AST 15 - 41 U/L 17   ALT 0 -  44 U/L 12    Edinburgh Score:     No data to display         No data recorded  After visit meds:  Allergies as of 01/07/2024       Reactions   Antihistamines, Diphenhydramine -type Anaphylaxis   Blueberry Flavor [flavoring Agent] Anaphylaxis   Citrus Hives, Other (See Comments)   Mouth Burns     Med Rec must be completed prior to using this SMARTLINK***        Discharge home in stable condition Infant Feeding: {Baby feeding:23562} Infant Disposition:{CHL IP OB HOME WITH  FNUYZM:76418} Discharge instruction: per After Visit Summary and Postpartum booklet. Activity: Advance as tolerated. Pelvic rest for 6 weeks.  Diet: {OB ipzu:78888878} Future Appointments: Future Appointments  Date Time Provider Department Center  01/09/2024 11:15 AM Fredirick Glenys RAMAN, MD [facility]  01/10/2024  7:15 AM [facility] PROVIDER 1 [facility]  01/10/2024  7:30 AM [facility] US1 [facility]  01/23/2024  9:55 AM Fredirick Glenys RAMAN, MD [facility]   Follow up Visit:   Please schedule this patient for a In person postpartum visit in 4 weeks with the following provider: Any provider. Additional Postpartum F/U:2 hour GTT  High risk pregnancy complicated by: GDM Delivery mode:  Vaginal, Spontaneous Anticipated Birth Control:  Nexplanon    01/07/2024 Burnard CHRISTELLA Moats, MD

## 2024-01-07 NOTE — MAU Note (Signed)
 Vicki Clayton is a 23 y.o. at [redacted]w[redacted]d here in MAU reporting: Contractions since 0600 that worsened around 0830, arrived via EMS and also reported no fetal movement.  LMP: [id number] Onset of complaint: 0600 this am Worsening round 0830 Pain score: 10/10 There were no vitals filed for this visit.  100/69, HR 99, SPO2 100, RR 22. CBG on EMS 107 FHT: 158  Lab orders placed from triage: none

## 2024-01-07 NOTE — Anesthesia Procedure Notes (Signed)
 Epidural Patient location during procedure: OB Start time: 01/07/2024 1:05 PM End time: 01/07/2024 1:12 PM  Staffing Anesthesiologist: Epifanio Charleston, MD Performed: anesthesiologist   Preanesthetic Checklist Completed: patient identified, IV checked, site marked, risks and benefits discussed, surgical consent, monitors and equipment checked, pre-op evaluation and timeout performed  Epidural Patient position: sitting Prep: DuraPrep and site prepped and draped Patient monitoring: continuous pulse ox and blood pressure Approach: midline Location: L4-L5 Injection technique: LOR air  Needle:  Needle type: Tuohy  Needle gauge: 17 G Needle length: 9 cm and 9 Needle insertion depth: 7 cm Catheter type: closed end flexible Catheter size: 19 Gauge Catheter at skin depth: 12 cm Test dose: negative  Assessment Events: blood not aspirated, no cerebrospinal fluid, injection not painful, no injection resistance, no paresthesia and negative IV test

## 2024-01-07 NOTE — Anesthesia Preprocedure Evaluation (Signed)
 Anesthesia Evaluation  Patient identified by MRN, date of birth, ID band Patient awake    Reviewed: Allergy & Precautions, Patient's Chart, lab work & pertinent test results  Airway Mallampati: II  TM Distance: >3 FB Neck ROM: Full    Dental  (+) Dental Advisory Given   Pulmonary neg pulmonary ROS   breath sounds clear to auscultation       Cardiovascular negative cardio ROS  Rhythm:Regular Rate:Normal     Neuro/Psych negative neurological ROS     GI/Hepatic negative GI ROS, Neg liver ROS,,,  Endo/Other  diabetes, Gestational    Renal/GU negative Renal ROS     Musculoskeletal   Abdominal   Peds  Hematology  (+) Blood dyscrasia, anemia   Anesthesia Other Findings   Reproductive/Obstetrics (+) Pregnancy                              Anesthesia Physical Anesthesia Plan  ASA: 2  Anesthesia Plan: Epidural   Post-op Pain Management:    Induction:   PONV Risk Score and Plan: 2 and Treatment may vary due to age or medical condition  Airway Management Planned: Natural Airway  Additional Equipment:   Intra-op Plan:   Post-operative Plan:   Informed Consent: I have reviewed the patients History and Physical, chart, labs and discussed the procedure including the risks, benefits and alternatives for the proposed anesthesia with the patient or authorized representative who has indicated his/her understanding and acceptance.       Plan Discussed with:   Anesthesia Plan Comments:         Anesthesia Quick Evaluation

## 2024-01-07 NOTE — Lactation Note (Signed)
 This note was copied from a baby's chart. Lactation Consultation Note  Patient Name: Vicki Clayton Unijb'd Date: 01/07/2024 Age:21 hours Reason for consult: Initial assessment;Primapara;1st time breastfeeding;NICU baby;Early term 37-38.6wks;Infant < 5lbs;Maternal endocrine disorder  P1- Infant born at [redacted]w[redacted]d GA weighing 2350g. Infant was admitted to the NICU room 320 for oxygen desaturation. MOB is on room 523 and has been set up with the hospital DEBP and pumped out 2 mL of EBM. LC praised MOB for her supply and encouraged pumping every 3 hrs to ensure proper stimulation. LC brought MOB's EBM to infant's room and provided MOB with milk storage labels. LC flange sized MOB at an 18 mm flange and reviewed hwo to use the pump. LC provided MOB with a manual pump, reviewed hwo to order a pump through [facility], sent [facility] referral and reviewed a [facility] loaner pump. MOB will think about the [facility] loaner and let us  know. LC encouraged MOB to call for further assistance as needed.  Maternal Data Has patient been taught Hand Expression?: No Does the patient have breastfeeding experience prior to this delivery?: No  Feeding Mother's Current Feeding Choice: Breast Milk and Formula  Lactation Tools Discussed/Used Tools: Pump;Flanges Flange Size: 18 Breast pump type: Double-Electric Breast Pump;Manual Pump Education: Setup, frequency, and cleaning;Milk Storage Reason for Pumping: Infant admitted to NICU Pumping frequency: 15-20 min every 3 hrs Pumped volume: 2 mL  Interventions Interventions: Breast feeding basics reviewed;Expressed milk;Hand pump;DEBP;Education;LC Services brochure  Discharge Discharge Education: Engorgement and breast care;Warning signs for feeding baby;Outpatient recommendation Pump: Manual;Advised to call insurance company [facility] Program: Yes  Consult Status Consult Status: NICU follow-up Date: 01/08/24 Follow-up type: In-patient    Recardo Hoit BS, IBCLC 01/07/2024, 9:01  PM

## 2024-01-07 NOTE — H&P (Addendum)
 Vicki Clayton is a 23 y.o. female presenting for SOL. Patient reports contractions starting ~6am this morning. Patient has A2GDM on Metformin  at bedtime . Poor fetal growth now resolved. Recently admitted for PTL contractions. Progressed to 4 cm  Initial NST FHT with minimal variability, IV fluids ordered.  OB History     Gravida  2   Para  0   Term  0   Preterm  0   AB  1   Living  0      SAB  1   IAB  0   Ectopic  0   Multiple  0   Live Births  0          Past Medical History:  Diagnosis Date   Anemia    Appendicitis    Heart murmur of newborn    Psychogenic nonepileptic seizure    Vasovagal syncope    Past Surgical History:  Procedure Laterality Date   ELBOW CLOSED REDUCTION W/ PERCUANEOUS PINNING     ELBOW SURGERY     KNEE ARTHROSCOPY WITH ANTERIOR CRUCIATE LIGAMENT (ACL) REPAIR Left 05/30/2019   Procedure: KNEE ARTHROSCOPY WITH ANTERIOR CRUCIATE LIGAMENT (ACL) REPAIR WITH QUADRICEPS AUTOGRAFT;  Surgeon: Cristy Bonner DASEN, MD;  Location: [facility];  Service: Orthopedics;  Laterality: Left;   Family History: family history includes Breast cancer in her maternal grandmother and paternal grandmother; Cancer in her father, mother, and another family member; Diabetes in her maternal grandmother and another family member; Heart disease in an other family member; Hypertension in her mother and another family member. Social History:  reports that she has never smoked. She has never been exposed to tobacco smoke. She has never used smokeless tobacco. She reports that she does not currently use alcohol. She reports that she does not use drugs.      NURSING  PROVIDER  Office Location [facility] > [place] at 23w Dating by LMP c/w U/S at 8 wks  [facility] Model Traditional Anatomy U/S Normal  Initiated care at  8wks                 Language  English               LAB RESULTS   Support Person   Genetics NIPS: low risk AFP: neg      Carrier Screen Horizon:  negative x 4  Rhogam  O/Positive/-- (06/02 1140) A1C/GTT Early HgbA1C: 5.3 Third trimester 2 hr GTT:   Flu Vaccine 11/01/23      TDaP Vaccine  11/01/23 Blood Type O/Positive/-- (06/02 1140)  RSV Vaccine   Antibody Negative (06/02 1140)  COVID Vaccine 1 Rubella 1.65 (06/02 1140)  Feeding Plan breast RPR Non Reactive (06/02 1140)  Contraception Nexplanon  HBsAg Negative (06/02 1140)  Circumcision Yes HIV Non Reactive (06/02 1140)  Pediatrician  [facility] HCVAb Non Reactive (06/02 1140)  Prenatal Classes        BTL Consent   Pap No results found for: DIAGPAP  BTL Pre-payment   GC/CT Initial:   36wks:    VBAC Consent   GBS For PCN allergy, check sensitivities   BRx Optimized? [X]  yes   [ ]  no      DME Rx [X]  BP cuff [X]  Weight Scale Waterbirth  [ ]  Class [ ]  Consent [ ]  CNM visit  PHQ9 & GAD7 [  ] new OB [  ] 28 weeks  [  ] 36 weeks Induction  [ ]  Orders Entered [ ]   Foley Y/N   Review of Systems  Constitutional:  Negative for chills, fatigue and fever.  Eyes:  Negative for pain and visual disturbance.  Respiratory:  Negative for apnea, shortness of breath and wheezing.   Cardiovascular:  Negative for chest pain and palpitations.  Gastrointestinal:  Positive for abdominal pain, nausea and vomiting. Negative for constipation and diarrhea.  Genitourinary:  Negative for difficulty urinating, dysuria, pelvic pain, vaginal bleeding, vaginal discharge and vaginal pain.  Musculoskeletal:  Positive for back pain.  Neurological:  Negative for seizures, weakness and headaches.  Psychiatric/Behavioral:  Negative for suicidal ideas.    Maternal Medical History:  Reason for admission: Contractions and nausea.   Contractions: Onset was 3-5 hours ago.   Frequency: irregular.   Perceived severity is moderate.   Fetal activity: Perceived fetal activity is normal.   Prenatal Complications - Diabetes: gestational. Diabetes is managed by oral agent (monotherapy).     Dilation: 5 Effacement  (%): 90 Station: -1 Exam by:: Vicki Dimes, RN Blood pressure 116/80, pulse (!) 114, temperature 98.8 F (37.1 C), temperature source Oral, resp. rate (!) 22, height 4' 11.49 (1.511 m), weight 76.9 kg, last menstrual period 04/23/2023, SpO2 98%. Maternal Exam:  Uterine Assessment: Contraction strength is moderate.  Contraction frequency is regular.  Abdomen: Fetal presentation: vertex Pelvis: adequate for delivery.   Cervix: Cervix evaluated by digital exam.     Fetal Exam Fetal Monitor Review: Baseline rate: 150.  Variability: moderate (6-25 bpm).   Pattern: accelerations present, early decelerations and variable decelerations.   Fetal State Assessment: Category II - tracings are indeterminate.   Physical Exam Vitals and nursing note reviewed.  Constitutional:      General: She is in acute distress.     Appearance: Normal appearance.  HENT:     Head: Normocephalic.  Pulmonary:     Effort: Pulmonary effort is normal.  Abdominal:     Comments: Gravid Uterus   Musculoskeletal:     Cervical back: Normal range of motion.  Skin:    General: Skin is warm and dry.  Neurological:     Mental Status: She is alert and oriented to person, place, and time.  Psychiatric:        Mood and Affect: Mood normal.     Prenatal labs: ABO, Rh: --/--/O POS (11/23 0039) Antibody: NEG (11/23 0039) Rubella: 1.65 (06/02 1140) RPR: NON REACTIVE (11/23 0039)  HBsAg: Negative (06/02 1140)  HIV: Non Reactive (10/01 0853)  GBS: Positive/-- (11/19 1300)   Assessment/Plan: Vicki Clayton , a  23 y.o. G2P0010 at [redacted]w[redacted]d presents for SOL.   Labor: Expectant Management  Pain: Planning epidural  FWB: Cat II continue to monitor.  GDM: patient not checking BS throughout the pregnancy, was started om Metformin  at bedtime.  I/D: GBS positive . MOF: Breast  Circ: N/A baby Girl NOVA MOC: Nexplanon   MOD: hopeful for vaginal delivery.    Vicki [name], MSN CNM  01/07/2024, 12:48  PM

## 2024-01-08 LAB — CBC
HCT: 25.5 % — ABNORMAL LOW (ref 36.0–46.0)
Hemoglobin: 8.6 g/dL — ABNORMAL LOW (ref 12.0–15.0)
MCH: 30.4 pg (ref 26.0–34.0)
MCHC: 33.7 g/dL (ref 30.0–36.0)
MCV: 90.1 fL (ref 80.0–100.0)
Platelets: 191 K/uL (ref 150–400)
RBC: 2.83 MIL/uL — ABNORMAL LOW (ref 3.87–5.11)
RDW: 14.3 % (ref 11.5–15.5)
WBC: 8 K/uL (ref 4.0–10.5)
nRBC: 0.4 % — ABNORMAL HIGH (ref 0.0–0.2)

## 2024-01-08 LAB — SYPHILIS: RPR W/REFLEX TO RPR TITER AND TREPONEMAL ANTIBODIES, TRADITIONAL SCREENING AND DIAGNOSIS ALGORITHM: RPR Ser Ql: NONREACTIVE

## 2024-01-08 LAB — GLUCOSE, CAPILLARY: Glucose-Capillary: 86 mg/dL (ref 70–99)

## 2024-01-08 MED ORDER — SODIUM CHLORIDE 0.9 % IV SOLN
500.0000 mg | Freq: Once | INTRAVENOUS | Status: AC
  Administered 2024-01-08: 500 mg via INTRAVENOUS
  Filled 2024-01-08: qty 25

## 2024-01-08 MED ORDER — GLYCOPYRROLATE 1 MG PO TABS: 1.0000 mg | ORAL_TABLET | Freq: Two times a day (BID) | ORAL | Status: DC | PRN

## 2024-01-08 NOTE — Lactation Note (Signed)
 This note was copied from a baby's chart. Lactation Consultation Note  Patient Name: Vicki Clayton Unijb'd Date: 01/08/2024 Age:21 hours Reason for consult: Follow-up assessment;Primapara;1st time breastfeeding;NICU baby;Early term 37-38.6wks;Infant < 6lbs;Maternal endocrine disorder  P1- Infant is still admitted to the NICU, but MOB reports that infant is doing much better than yesterday. MOB reports that infant had a bottle today and was able to take 18 mL. LC praised infant for her hard work. MOB informed LC that she doesn't think she wants to breastfeed anymore. LC reassured MOB that this is okay and it is her choice. LC reviewed lactation suppression techniques with MOB. LC encouraged MOB to still call if she needs assistance. MOB informed LC that after her iron  infusion, she feels dizzy like she is going to faint. LC encouraged MOB to not get up without calling for assistance. LC informed RN of MOB's complaint.  Maternal Data Has patient been taught Hand Expression?: No Does the patient have breastfeeding experience prior to this delivery?: No  Feeding Mother's Current Feeding Choice: Formula Nipple Type: Nfant Extra Slow Flow (gold)  Consult Status Consult Status: Complete (mother declined follow up) Date: 01/08/24    Recardo Hoit BS, IBCLC 01/08/2024, 8:03 PM

## 2024-01-08 NOTE — Progress Notes (Signed)

## 2024-01-08 NOTE — Lactation Note (Signed)
 This note was copied from a baby's chart. Lactation Consultation Note  Patient Name: Vicki Clayton Date: 01/08/2024 Age:21 hours   LC attempted to visit with Mom, but she was sound asleep.  LC to F/U at a later time.  [name] 01/08/2024, 4:50 PM

## 2024-01-08 NOTE — Anesthesia Postprocedure Evaluation (Signed)
 Anesthesia Post Note  Patient: Vicki Clayton  Procedure(s) Performed: AN AD HOC LABOR EPIDURAL     Patient location during evaluation: Mother Baby Anesthesia Type: Epidural Level of consciousness: awake and alert Pain management: pain level controlled Vital Signs Assessment: post-procedure vital signs reviewed and stable Respiratory status: spontaneous breathing, nonlabored ventilation and respiratory function stable Cardiovascular status: stable Postop Assessment: no headache, no backache and epidural receding Anesthetic complications: no   No notable events documented.  Last Vitals:  Vitals:   01/08/24 0336 01/08/24 0500  BP:  101/76  Pulse:  67  Resp:  16  Temp: (!) 36.2 C 36.8 C  SpO2:  100%    Last Pain:  Vitals:   01/08/24 0730  TempSrc:   PainSc: 2    Pain Goal: Patients Stated Pain Goal: 0 (01/07/24 1121)              Epidural/Spinal Function Cutaneous sensation: Normal sensation (01/08/24 0730), Patient able to flex knees: Yes (01/08/24 0730), Patient able to lift hips off bed: Yes (01/08/24 0730), Back pain beyond tenderness at insertion site: No (01/08/24 0730), Progressively worsening motor and/or sensory loss: No (01/08/24 0730), Bowel and/or bladder incontinence post epidural: No (01/08/24 0730)  Walther Sanagustin

## 2024-01-08 NOTE — Progress Notes (Signed)
 Post Partum Day 1 Subjective: no complaints, up ad lib, tolerating PO, + flatus, and not voiding, s/p I&O cath 0400  Objective: Blood pressure 101/76, pulse 67, temperature 98.2 F (36.8 C), temperature source Oral, resp. rate 16, height 4' 11.49 (1.511 m), weight 76.9 kg, last menstrual period 04/23/2023, SpO2 100%, unknown if currently breastfeeding.  Physical Exam:  General: alert and no distress Lochia: appropriate Incision: n/a DVT Evaluation: No evidence of DVT seen on physical exam.  Recent Labs    01/07/24 1234 01/08/24 0422  HGB 9.5* 8.6*  HCT 27.6* 25.5*    Assessment/Plan: Plan for discharge tomorrow, Breastfeeding, Lactation consult, and Contraception Nexplanon . Vicki Clayton is currently in the NICU.  Has been unable to void, had I&O cath at 0400, voided this morning, no cath needed currently  Discussed hgb 8.6, offered IV iron , pt amenable, orders placed Ptyalism during pregnancy, re-ordered today, plan to send home with pt    LOS: 1 day   Vicki Daring, FNP 01/08/2024, 11:27 AM

## 2024-01-09 ENCOUNTER — Other Ambulatory Visit (HOSPITAL_COMMUNITY): Payer: Self-pay

## 2024-01-09 ENCOUNTER — Encounter: Admitting: Family Medicine

## 2024-01-09 LAB — CBC
HCT: 25.3 % — ABNORMAL LOW (ref 36.0–46.0)
Hemoglobin: 8.6 g/dL — ABNORMAL LOW (ref 12.0–15.0)
MCH: 31.5 pg (ref 26.0–34.0)
MCHC: 34 g/dL (ref 30.0–36.0)
MCV: 92.7 fL (ref 80.0–100.0)
Platelets: 182 K/uL (ref 150–400)
RBC: 2.73 MIL/uL — ABNORMAL LOW (ref 3.87–5.11)
RDW: 14.6 % (ref 11.5–15.5)
WBC: 8.6 K/uL (ref 4.0–10.5)
nRBC: 0 % (ref 0.0–0.2)

## 2024-01-09 MED ORDER — SENNOSIDES-DOCUSATE SODIUM 8.6-50 MG PO TABS
2.0000 | ORAL_TABLET | Freq: Every day | ORAL | 0 refills | Status: DC
  Filled 2024-01-09: qty 60, 30d supply, fill #0

## 2024-01-09 MED ORDER — IBUPROFEN 600 MG PO TABS
600.0000 mg | ORAL_TABLET | Freq: Four times a day (QID) | ORAL | 0 refills | Status: DC
  Filled 2024-01-09: qty 30, 8d supply, fill #0

## 2024-01-09 MED ORDER — GLYCOPYRROLATE 1 MG PO TABS
1.0000 mg | ORAL_TABLET | Freq: Once | ORAL | Status: AC
  Administered 2024-01-09: 1 mg via ORAL
  Filled 2024-01-09: qty 1

## 2024-01-09 MED ORDER — ETONOGESTREL 68 MG ~~LOC~~ IMPL
68.0000 mg | DRUG_IMPLANT | Freq: Once | SUBCUTANEOUS | Status: DC
  Filled 2024-01-09: qty 1

## 2024-01-09 MED ORDER — LIDOCAINE HCL 1 % IJ SOLN
0.0000 mL | Freq: Once | INTRAMUSCULAR | Status: DC | PRN
  Filled 2024-01-09: qty 20

## 2024-01-09 MED ORDER — FAMOTIDINE 20 MG PO TABS
20.0000 mg | ORAL_TABLET | Freq: Two times a day (BID) | ORAL | 0 refills | Status: DC
  Filled 2024-01-09: qty 30, 15d supply, fill #0

## 2024-01-09 MED ORDER — GLYCOPYRROLATE 1 MG PO TABS
1.0000 mg | ORAL_TABLET | Freq: Two times a day (BID) | ORAL | 0 refills | Status: DC | PRN
  Filled 2024-01-09: qty 60, 30d supply, fill #0

## 2024-01-09 MED ORDER — FAMOTIDINE 20 MG PO TABS
10.0000 mg | ORAL_TABLET | Freq: Every day | ORAL | Status: DC
  Administered 2024-01-09: 10 mg via ORAL
  Filled 2024-01-09: qty 1

## 2024-01-09 MED ORDER — GLYCOPYRROLATE 1 MG PO TABS
1.0000 mg | ORAL_TABLET | Freq: Once | ORAL | Status: DC
  Filled 2024-01-09: qty 1

## 2024-01-09 MED ORDER — ACETAMINOPHEN 325 MG PO TABS
650.0000 mg | ORAL_TABLET | ORAL | 0 refills | Status: DC | PRN
  Filled 2024-01-09: qty 60, 5d supply, fill #0

## 2024-01-09 MED ORDER — FAMOTIDINE 10 MG PO TABS
10.0000 mg | ORAL_TABLET | Freq: Every day | ORAL | 0 refills | Status: DC
  Filled 2024-01-09: qty 30, 30d supply, fill #0

## 2024-01-10 ENCOUNTER — Ambulatory Visit

## 2024-01-10 ENCOUNTER — Other Ambulatory Visit

## 2024-01-10 NOTE — Lactation Note (Signed)
 This note was copied from a baby's chart. Lactation Consultation Note  Patient Name: Girl Nashaly Dorantes Unijb'd Date: 01/10/2024 Age:21 hours   NICU RN Jamel called out for lactation because Ms. Nazzaro started pumping, she got 5 ml of breastmilk which was used for baby's last feeding. She also told her night shift RN that she started leaking. When meeting with Ms. Fundora she clarified that she hasn't changed her mind about pumping, she's only doing it for comfort since her breasts got too uncomfortable last night. Revised lactation suppression and let her know that [facility] services will be available in case any questions or concerns arise. NICU [facility] services are completed at this time.   Sajad Glander S Tavarus Poteete 01/10/2024, 10:54 AM

## 2024-01-16 ENCOUNTER — Telehealth (HOSPITAL_COMMUNITY): Payer: Self-pay | Admitting: *Deleted

## 2024-01-16 NOTE — Telephone Encounter (Signed)
 01/16/2024  Name: Vicki Clayton MRN: [id number] DOB: 2000-11-19  Reason for Call:  Transition of Care Hospital Discharge Call  Contact Status: Patient Contact Status: Message  Language assistant needed:          Follow-Up Questions:    Van Postnatal Depression Scale:  In the Past 7 Days:    PHQ2-9 Depression Scale:     Discharge Follow-up:    Post-discharge interventions: NA  Mliss Sieve, RN 01/16/2024 11:35

## 2024-01-23 ENCOUNTER — Encounter: Admitting: Family Medicine

## 2024-02-20 ENCOUNTER — Other Ambulatory Visit

## 2024-02-20 ENCOUNTER — Encounter: Payer: Self-pay | Admitting: Obstetrics and Gynecology

## 2024-02-20 ENCOUNTER — Other Ambulatory Visit (HOSPITAL_COMMUNITY)
Admission: RE | Admit: 2024-02-20 | Discharge: 2024-02-20 | Disposition: A | Source: Ambulatory Visit | Attending: Obstetrics and Gynecology | Admitting: Obstetrics and Gynecology

## 2024-02-20 ENCOUNTER — Ambulatory Visit (INDEPENDENT_AMBULATORY_CARE_PROVIDER_SITE_OTHER): Admitting: Obstetrics and Gynecology

## 2024-02-20 VITALS — BP 137/84 | HR 71 | Wt 175.0 lb

## 2024-02-20 DIAGNOSIS — Z30017 Encounter for initial prescription of implantable subdermal contraceptive: Secondary | ICD-10-CM | POA: Diagnosis not present

## 2024-02-20 DIAGNOSIS — Z8632 Personal history of gestational diabetes: Secondary | ICD-10-CM | POA: Diagnosis not present

## 2024-02-20 DIAGNOSIS — Z124 Encounter for screening for malignant neoplasm of cervix: Secondary | ICD-10-CM | POA: Insufficient documentation

## 2024-02-20 DIAGNOSIS — Z6834 Body mass index (BMI) 34.0-34.9, adult: Secondary | ICD-10-CM | POA: Insufficient documentation

## 2024-02-20 DIAGNOSIS — F53 Postpartum depression: Secondary | ICD-10-CM

## 2024-02-20 DIAGNOSIS — R6889 Other general symptoms and signs: Secondary | ICD-10-CM | POA: Diagnosis not present

## 2024-02-20 HISTORY — PX: INSERTION OF IMPLANON ROD: OBO 1005

## 2024-02-20 MED ORDER — ETONOGESTREL 68 MG ~~LOC~~ IMPL
68.0000 mg | DRUG_IMPLANT | Freq: Once | SUBCUTANEOUS | Status: AC
  Administered 2024-02-20: 68 mg via SUBCUTANEOUS

## 2024-02-20 MED ORDER — PRENATAL 28-0.8 MG PO TABS: 1.0000 | ORAL_TABLET | Freq: Every day | ORAL | Status: AC

## 2024-02-20 NOTE — Progress Notes (Signed)
" ° ° °  Post Partum Visit Note  Vicki Clayton is a 24 y.o. G2P1011 s/p 12/7 SVD/right vaginal sidewall lac and peri-uretheral  after 37wk GDMA2 (metformin ) IOL    Anesthesia: epidural. Postpartum course has been uncomplicated . Baby is doing well. Baby is feeding by pumping . Bleeding started cycle yesterday. Bowel function is normal. Bladder function is normal. Patient is sexually active. Contraception method is condoms. Wants IUD or Nexplanon  pt is anxious about procedure. Postpartum depression screening: positive.EPDS 14   Patient lives at home with FOB who works and she states that things are overall going well. Patient looking from employment currently.    Edinburgh Postnatal Depression Scale - 02/20/24 0824       Edinburgh Postnatal Depression Scale:  In the Past 7 Days   I have been able to laugh and see the funny side of things. 0    I have looked forward with enjoyment to things. 1    I have blamed myself unnecessarily when things went wrong. 3    I have been anxious or worried for no good reason. 3    I have felt scared or panicky for no good reason. 3    Things have been getting on top of me. 2    I have been so unhappy that I have had difficulty sleeping. 0    I have felt sad or miserable. 0    I have been so unhappy that I have been crying. 2    The thought of harming myself has occurred to me. 0    Edinburgh Postnatal Depression Scale Total 14         Review of Systems Pertinent items are noted in HPI.  Objective:  BP 137/84   Pulse 71   Wt 175 lb (79.4 kg)   LMP 04/23/2023   Breastfeeding Yes   BMI 34.77 kg/m    General: NAD Pelvic exam: VULVA: normal appearing vulva with no masses, tenderness or lesions, VAGINA: wnl, small amount of old blood, no active bleeding, CERVIX: normal appearing cervix without discharge or lesions, UTERUS: uterus is normal size, shape, consistency and nontender, exam chaperoned by CMA.  Assessment:   Patient doing well    Plan:  *PP: routine care. Patient amenable to [facility] visit. Follow up pap. D/w her re: options and she had used depo (was amenorrheic) and OCPs, which she didn't like. Pt decided on nexplanon , which was placed today *GDMA2: patient ate today. Will reschedule *Excessive spitting: will get CBC with GTT  Bebe Furry, MD [facility]  "

## 2024-02-20 NOTE — Procedures (Signed)
Nexplanon Insertion Procedure Note  After informed consent was obtained, the patient's non-dominant left arm was chosen for insertion. A site was marked approximately 8 cm proximal to the medial epicondyle in the sulcus between the biceps and triceps on the inner surface. The area was cleaned with alcohol then local anesthesia was infiltrated with 3 ml of 1% lidocaine along the planned insertion track. The area was prepped with betadine. Using sterile technique the Nexplanon device was inserted per manufacturer's guidelines in the subdermal connective tissue using the standard insertion technique without difficulty. Pressure was applied and the insertion site was hemostatic. The presence of the Nexplanon was confirmed immediately after insertion by palpation by both me and the patient and by checking the tip of needle for the absence of the insert.  A pressure dressing was applied.   The patient tolerated the procedure well.  Zorah Backes, Jr MD [facility] ([facility])  

## 2024-02-22 ENCOUNTER — Ambulatory Visit: Payer: Self-pay | Admitting: Obstetrics and Gynecology

## 2024-02-22 LAB — CYTOLOGY - PAP: Diagnosis: NEGATIVE

## 2024-02-27 ENCOUNTER — Other Ambulatory Visit

## 2024-02-29 ENCOUNTER — Other Ambulatory Visit
